# Patient Record
Sex: Male | Born: 1960 | Race: White | Hispanic: No | Marital: Single | Smoking: Former smoker
Health system: Southern US, Community
[De-identification: ages and names within clinical notes are randomized; demographics above are authoritative.]

## PROBLEM LIST (undated history)

## (undated) DIAGNOSIS — T7840XA Allergy, unspecified, initial encounter: Secondary | ICD-10-CM

## (undated) DIAGNOSIS — R0600 Dyspnea, unspecified: Secondary | ICD-10-CM

## (undated) DIAGNOSIS — D689 Coagulation defect, unspecified: Secondary | ICD-10-CM

## (undated) DIAGNOSIS — J385 Laryngeal spasm: Secondary | ICD-10-CM

## (undated) DIAGNOSIS — E785 Hyperlipidemia, unspecified: Secondary | ICD-10-CM

## (undated) DIAGNOSIS — K429 Umbilical hernia without obstruction or gangrene: Secondary | ICD-10-CM

## (undated) HISTORY — DX: Hyperlipidemia, unspecified: E78.5

## (undated) HISTORY — DX: Laryngeal spasm: J38.5

## (undated) HISTORY — DX: Dyspnea, unspecified: R06.00

## (undated) HISTORY — DX: Allergy, unspecified, initial encounter: T78.40XA

## (undated) HISTORY — DX: Umbilical hernia without obstruction or gangrene: K42.9

## (undated) HISTORY — DX: Coagulation defect, unspecified: D68.9

## (undated) HISTORY — PX: NASAL SEPTUM SURGERY: SHX37

## (undated) HISTORY — PX: POLYPECTOMY: SHX149

---

## 1999-11-16 ENCOUNTER — Other Ambulatory Visit: Admission: RE | Admit: 1999-11-16 | Discharge: 1999-11-16 | Payer: Self-pay | Admitting: Urology

## 2000-12-28 ENCOUNTER — Encounter: Payer: Self-pay | Admitting: Emergency Medicine

## 2000-12-28 ENCOUNTER — Emergency Department (HOSPITAL_COMMUNITY): Admission: EM | Admit: 2000-12-28 | Discharge: 2000-12-28 | Payer: Self-pay | Admitting: *Deleted

## 2005-08-05 ENCOUNTER — Ambulatory Visit: Payer: Self-pay | Admitting: Family Medicine

## 2011-09-14 ENCOUNTER — Encounter: Payer: Self-pay | Admitting: Internal Medicine

## 2011-10-05 ENCOUNTER — Encounter: Payer: Self-pay | Admitting: Internal Medicine

## 2011-10-13 ENCOUNTER — Ambulatory Visit (INDEPENDENT_AMBULATORY_CARE_PROVIDER_SITE_OTHER): Payer: 59 | Admitting: Cardiology

## 2011-10-13 ENCOUNTER — Encounter: Payer: Self-pay | Admitting: Cardiology

## 2011-10-13 VITALS — BP 160/100 | HR 89 | Ht 67.0 in | Wt 227.0 lb

## 2011-10-13 DIAGNOSIS — F17200 Nicotine dependence, unspecified, uncomplicated: Secondary | ICD-10-CM

## 2011-10-13 DIAGNOSIS — E663 Overweight: Secondary | ICD-10-CM

## 2011-10-13 DIAGNOSIS — Z720 Tobacco use: Secondary | ICD-10-CM | POA: Insufficient documentation

## 2011-10-13 DIAGNOSIS — R0602 Shortness of breath: Secondary | ICD-10-CM

## 2011-10-13 DIAGNOSIS — R0600 Dyspnea, unspecified: Secondary | ICD-10-CM | POA: Insufficient documentation

## 2011-10-13 DIAGNOSIS — R9431 Abnormal electrocardiogram [ECG] [EKG]: Secondary | ICD-10-CM | POA: Insufficient documentation

## 2011-10-13 NOTE — Progress Notes (Signed)
   HPI The patient presents for evaluation of an abnormal EKG. He was recently noted to have a right bundle branch block. He has no history of this there was no old EKG for comparison. He denies any cardiovascular history he has had no testing in the past. He denies any cardiovascular symptoms. He works a vigorous exerting job. He walks quite a bit looking for arrowheads.  The patient denies any new symptoms such as chest discomfort, neck or arm discomfort. There has been no PND or orthopnea. There have been no reported palpitations, presyncope or syncope.  He does get some dyspnea walking up an incline which has been slowly progressive over the past year. He denies any edema.   No Known Allergies  Current Outpatient Prescriptions  Medication Sig Dispense Refill  . aspirin 325 MG tablet Take 325 mg by mouth daily.      . niacin (NIASPAN) 500 MG CR tablet Take 500 mg by mouth at bedtime.        Past Medical History  Diagnosis Date  . Hyperlipidemia     Past Surgical History  Procedure Date  . Nasal septum surgery     Family History  Problem Relation Age of Onset  . Asthma Mother   . Hypertension Father   . Peripheral vascular disease Father 66  . Coronary artery disease Father 76    History   Social History  . Marital Status: Single    Spouse Name: N/A    Number of Children: 1  . Years of Education: N/A   Occupational History  . Not on file.   Social History Main Topics  . Smoking status: Current Everyday Smoker -- 1.0 packs/day for 25 years    Types: Cigarettes  . Smokeless tobacco: Not on file  . Alcohol Use: Not on file  . Drug Use: Not on file  . Sexually Active: Not on file   Other Topics Concern  . Not on file   Social History Narrative   Lives alone.    ROS:  As stated in the HPI and negative for all other systems.   PHYSICAL EXAM BP 160/100  Pulse 89  Ht 5\' 7"  (1.702 m)  Wt 227 lb (102.967 kg)  BMI 35.55 kg/m2 GENERAL:  Well appearing HEENT:   Pupils equal round and reactive, fundi not visualized, oral mucosa unremarkable NECK:  No jugular venous distention, waveform within normal limits, carotid upstroke brisk and symmetric, no bruits, no thyromegaly LYMPHATICS:  No cervical, inguinal adenopathy LUNGS:  Clear to auscultation bilaterally BACK:  No CVA tenderness CHEST:  Unremarkable HEART:  PMI not displaced or sustained,S1 and S2 within normal limits, no S3, no S4, no clicks, no rubs, no murmurs ABD:  Flat, positive bowel sounds normal in frequency in pitch, no bruits, no rebound, no guarding, no midline pulsatile mass, no hepatomegaly, no splenomegaly EXT:  2 plus pulses throughout, no edema, no cyanosis no clubbing SKIN:  No rashes no nodules NEURO:  Cranial nerves II through XII grossly intact, motor grossly intact throughout PSYCH:  Cognitively intact, oriented to person place and time  EKG: Sinus rhythm, rate 82, right bundle branch block, no acute ST-T wave changes, no EKGs for comparison.  09/07/11  ASSESSMENT AND PLAN

## 2011-10-13 NOTE — Assessment & Plan Note (Signed)
This will be evaluated as above. I will also check a BNP level.

## 2011-10-13 NOTE — Assessment & Plan Note (Signed)
He has right bundle branch block but no suggestion of ischemia or other structural heart disease.  I will bring the patient back for a POET (Plain Old Exercise Test). This will allow me to screen for obstructive coronary disease, risk stratify and very importantly provide a prescription for exercise.  I will do this because of his risk factors and the dyspnea.

## 2011-10-13 NOTE — Assessment & Plan Note (Signed)
We discussed a specific strategy for tobacco cessation.  (Greater than three minutes discussing tobacco cessation.)  He's not sure he wants to stop smoking.

## 2011-10-13 NOTE — Patient Instructions (Addendum)
The current medical regimen is effective;  continue present plan and medications.  Your physician has requested that you have an exercise tolerance test. For further information please visit [email or web address] Please also follow instruction sheet, as given.  Please have blood work this same day (BNP).

## 2011-10-13 NOTE — Assessment & Plan Note (Signed)
The patient understands the need to lose weight with diet and exercise. We have discussed specific strategies for this.  

## 2011-11-17 ENCOUNTER — Encounter: Payer: Self-pay | Admitting: Physician Assistant

## 2011-11-17 ENCOUNTER — Ambulatory Visit (INDEPENDENT_AMBULATORY_CARE_PROVIDER_SITE_OTHER): Payer: 59 | Admitting: Physician Assistant

## 2011-11-17 DIAGNOSIS — R9431 Abnormal electrocardiogram [ECG] [EKG]: Secondary | ICD-10-CM

## 2011-11-17 NOTE — Procedures (Signed)
Exercise Treadmill Test  Pre-Exercise Testing Evaluation Rhythm: normal sinus /RBBB Rate: 81   PR:  .13 QRS:  .12  QT:  .38 QTc: .45     Test  Exercise Tolerance Test Ordering MD: Angelina Sheriff, MD  Interpreting MD: Tereso Newcomer , PA-C  Unique Test No: 1  Treadmill:  1  Indication for ETT: Abnormal EKG  Contraindication to ETT: No   Stress Modality: exercise - treadmill  Cardiac Imaging Performed: non   Protocol: standard Bruce - maximal  Max BP:  208/68  Max MPHR (bpm):  168 85% MPR (bpm):  142  MPHR obtained (bpm): 144 % MPHR obtained:  86%  Reached 85% MPHR (min:sec):  5:58 Total Exercise Time (min-sec):  6:00  Workload in METS:  7.0 Borg Scale: 15  Reason ETT Terminated:  dyspnea    ST Segment Analysis At Rest: normal ST segments - no evidence of significant ST depression With Exercise: no evidence of significant ST depression  Other Information Arrhythmia:  No Angina during ETT:  absent (0) Quality of ETT:  diagnostic  ETT Interpretation:  normal - no evidence of ischemia by ST analysis  Comments: Fair exercise tolerance. Poor exercise recovery. No chest pain. Normal BP response to exercise. No ST-T changes to suggest ischemia.   Recommendations: Follow up with Dr. Rollene Rotunda as directed. Tereso Newcomer, PA-C  9:14 AM 11/17/2011

## 2013-07-16 ENCOUNTER — Ambulatory Visit (INDEPENDENT_AMBULATORY_CARE_PROVIDER_SITE_OTHER): Payer: 59 | Admitting: Family Medicine

## 2013-07-16 ENCOUNTER — Encounter: Payer: Self-pay | Admitting: Family Medicine

## 2013-07-16 VITALS — BP 128/73 | HR 77 | Temp 99.3°F | Ht 66.0 in | Wt 217.6 lb

## 2013-07-16 DIAGNOSIS — R509 Fever, unspecified: Secondary | ICD-10-CM

## 2013-07-16 DIAGNOSIS — J209 Acute bronchitis, unspecified: Secondary | ICD-10-CM

## 2013-07-16 DIAGNOSIS — R05 Cough: Secondary | ICD-10-CM

## 2013-07-16 DIAGNOSIS — R059 Cough, unspecified: Secondary | ICD-10-CM

## 2013-07-16 DIAGNOSIS — R52 Pain, unspecified: Secondary | ICD-10-CM

## 2013-07-16 LAB — POCT INFLUENZA A/B
Influenza A, POC: NEGATIVE
Influenza B, POC: NEGATIVE

## 2013-07-16 MED ORDER — PREDNISONE 10 MG PO TABS: ORAL_TABLET | ORAL | Status: DC

## 2013-07-16 MED ORDER — BENZONATATE 100 MG PO CAPS: 200.0000 mg | ORAL_CAPSULE | Freq: Three times a day (TID) | ORAL | Status: DC | PRN

## 2013-07-16 MED ORDER — LEVOFLOXACIN 500 MG PO TABS: 500.0000 mg | ORAL_TABLET | Freq: Every day | ORAL | Status: DC

## 2013-07-16 NOTE — Progress Notes (Signed)
   Subjective:    Patient ID: Jeffrey Abbott, male    DOB: Feb 15, 1961, 53 y.o.   MRN: [id number]  HPI This 53 y.o. male presents for evaluation of cough and uri sx's for over a week.   Review of Systems No chest pain, SOB, HA, dizziness, vision change, N/V, diarrhea, constipation, dysuria, urinary urgency or frequency, myalgias, arthralgias or rash.     Objective:   Physical Exam Vital signs noted  Well developed well nourished male.  HEENT - Head atraumatic Normocephalic                Eyes - PERRLA, Conjuctiva - clear Sclera- Clear EOMI                Ears - EAC's Wnl TM's Wnl Gross Hearing WNL                Nose - Nares patent                 Throat - oropharanx wnl Respiratory - Lungs CTA bilateral Cardiac - RRR S1 and S2 without murmur GI - Abdomen soft Nontender and bowel sounds active x 4 Extremities - No edema. Neuro - Grossly intact.  Results for orders placed in visit on 07/16/13  POCT INFLUENZA A/B      Result Value Ref Range   Influenza A, POC Negative     Influenza B, POC Negative         Assessment & Plan:  Cough - Plan: POCT Influenza A/B, levofloxacin (LEVAQUIN) 500 MG tablet, predniSONE (DELTASONE) 10 MG tablet, benzonatate (TESSALON PERLES) 100 MG capsule  Fever - Plan: POCT Influenza A/B, levofloxacin (LEVAQUIN) 500 MG tablet, predniSONE (DELTASONE) 10 MG tablet, benzonatate (TESSALON PERLES) 100 MG capsule  Body aches - Plan: POCT Influenza A/B, levofloxacin (LEVAQUIN) 500 MG tablet, predniSONE (DELTASONE) 10 MG tablet, benzonatate (TESSALON PERLES) 100 MG capsule  Acute bronchitis - Plan: levofloxacin (LEVAQUIN) 500 MG tablet, predniSONE (DELTASONE) 10 MG tablet, benzonatate (TESSALON PERLES) 100 MG capsule  Lysbeth Penner FNP

## 2014-04-09 ENCOUNTER — Telehealth: Payer: Self-pay | Admitting: Family Medicine

## 2014-04-09 NOTE — Telephone Encounter (Signed)
Left message. Last office visit 07-16-13.  He will need an office visit to dx problem.

## 2014-12-26 ENCOUNTER — Ambulatory Visit: Payer: Self-pay | Admitting: Family Medicine

## 2014-12-27 ENCOUNTER — Encounter (INDEPENDENT_AMBULATORY_CARE_PROVIDER_SITE_OTHER): Payer: Self-pay

## 2014-12-27 ENCOUNTER — Ambulatory Visit (INDEPENDENT_AMBULATORY_CARE_PROVIDER_SITE_OTHER): Payer: 59 | Admitting: Family Medicine

## 2014-12-27 ENCOUNTER — Encounter: Payer: Self-pay | Admitting: Family Medicine

## 2014-12-27 VITALS — BP 137/83 | HR 78 | Temp 97.6°F | Ht 66.0 in | Wt 233.6 lb

## 2014-12-27 DIAGNOSIS — K429 Umbilical hernia without obstruction or gangrene: Secondary | ICD-10-CM | POA: Insufficient documentation

## 2014-12-27 NOTE — Progress Notes (Signed)
   HPI  Patient presents today for evaluation of hernia  Patient explains that it has shown up over the last year to year and a half. He states that it's getting slightly bigger but really not bothering him. He states that very rarely he does certain movements that cause a slight twinge of pain in the area.  He denies any episodes of incarceration or concern for incarceration. He also denies fever, chills, sweats. He states that it's soft all the time and largely nontender even to palpation.  He's never had abdominal surgery.  Stopped smoking 6 months ago  PMH: Smoking status noted ROS: Per HPI  Objective: BP 137/83 mmHg  Pulse 78  Temp(Src) 97.6 F (36.4 C) (Oral)  Ht 5\' 6"  (1.676 m)  Wt 233 lb 9.6 oz (105.96 kg)  BMI 37.72 kg/m2 Gen: NAD, alert, cooperative with exam HEENT: NCAT CV: RRR, good S1/S2, no murmur Resp: CTABL, no wheezes, non-labored Abd: Soft, nontender nondistended, small to medium umbilical hernia at the superior edge of the umbilicus, slight tenderness to palpation, the abdominal wall defect is felt and it's completely reducible. Ext: No edema, warm Neuro: Alert and oriented, No gross deficits  Assessment and plan:  # Umbilical hernia Provider reassurance, discussed reasons to be referred and offered referral today. After discussion he's not having much pain and it's not bothering him so he would like to wait the referral. I explained signs of incarceration and other reasons to seek emergency medical care. I explained also that I be happy to refer him to general surgery if it becomes bothersome in the future.  Laroy Apple, MD [facility] 12/27/2014, 5:18 PM

## 2014-12-27 NOTE — Patient Instructions (Signed)
Great to meet you!  Call if your hernia is getting worse and you are ready to talk to the surgeons  Hernia A hernia occurs when an internal organ pushes out through a weak spot in the abdominal wall. Hernias most commonly occur in the groin and around the navel. Hernias often can be pushed back into place (reduced). Most hernias tend to get worse over time. Some abdominal hernias can get stuck in the opening (irreducible or incarcerated hernia) and cannot be reduced. An irreducible abdominal hernia which is tightly squeezed into the opening is at risk for impaired blood supply (strangulated hernia). A strangulated hernia is a medical emergency. Because of the risk for an irreducible or strangulated hernia, surgery may be recommended to repair a hernia. CAUSES   Heavy lifting.  Prolonged coughing.  Straining to have a bowel movement.  A cut (incision) made during an abdominal surgery. HOME CARE INSTRUCTIONS   Bed rest is not required. You may continue your normal activities.  Avoid lifting more than 10 pounds (4.5 kg) or straining.  Cough gently. If you are a smoker it is best to stop. Even the best hernia repair can break down with the continual strain of coughing. Even if you do not have your hernia repaired, a cough will continue to aggravate the problem.  Do not wear anything tight over your hernia. Do not try to keep it in with an outside bandage or truss. These can damage abdominal contents if they are trapped within the hernia sac.  Eat a normal diet.  Avoid constipation. Straining over long periods of time will increase hernia size and encourage breakdown of repairs. If you cannot do this with diet alone, stool softeners may be used. SEEK IMMEDIATE MEDICAL CARE IF:   You have a fever.  You develop increasing abdominal pain.  You feel nauseous or vomit.  Your hernia is stuck outside the abdomen, looks discolored, feels hard, or is tender.  You have any changes in your  bowel habits or in the hernia that are unusual for you.  You have increased pain or swelling around the hernia.  You cannot push the hernia back in place by applying gentle pressure while lying down. MAKE SURE YOU:   Understand these instructions.  Will watch your condition.  Will get help right away if you are not doing well or get worse. Document Released: 03/29/2005 Document Revised: 06/21/2011 Document Reviewed: 11/16/2007 [facility] Patient Information 2015 [place], [place]. This information is not intended to replace advice given to you by your health care provider. Make sure you discuss any questions you have with your health care provider.

## 2015-07-21 ENCOUNTER — Telehealth: Payer: Self-pay | Admitting: Family Medicine

## 2015-07-21 DIAGNOSIS — K429 Umbilical hernia without obstruction or gangrene: Secondary | ICD-10-CM

## 2015-07-21 NOTE — Telephone Encounter (Signed)
Referral written.   Laroy Apple, MD [facility] 07/21/2015, 5:14 PM

## 2015-07-21 NOTE — Telephone Encounter (Signed)
Patient seen 12-27-2014 for hernia.  He is requesting referral for surgeon. Please advise.

## 2015-07-22 NOTE — Telephone Encounter (Signed)
Patient informed via voicemail that referral has been made and he will be getting a call with appointment information.

## 2016-02-17 ENCOUNTER — Encounter: Payer: Self-pay | Admitting: Internal Medicine

## 2016-04-13 ENCOUNTER — Ambulatory Visit: Payer: 59 | Admitting: *Deleted

## 2016-04-13 ENCOUNTER — Telehealth: Payer: Self-pay | Admitting: *Deleted

## 2016-04-13 VITALS — Ht 67.0 in | Wt 246.0 lb

## 2016-04-13 DIAGNOSIS — Z1211 Encounter for screening for malignant neoplasm of colon: Secondary | ICD-10-CM

## 2016-04-13 MED ORDER — NA SULFATE-K SULFATE-MG SULF 17.5-3.13-1.6 GM/177ML PO SOLN: ORAL | 0 refills | Status: DC

## 2016-04-13 NOTE — Telephone Encounter (Signed)
Jeffrey Abbott,  This pt is cleared for anesthetic care at [facility].  Osvaldo Angst

## 2016-04-13 NOTE — Telephone Encounter (Signed)
John,  Could you please look at this pt's chart?  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  Just wanted to let you know.  Thanks, [employer]

## 2016-04-13 NOTE — Progress Notes (Signed)
No egg or soy allergy  No anesthesia or intubation problems per pt  No diet medications taken  Registered in [place]  No sleep apnea hx or oxygen used  Hx of laryngospasms- note sent to [employer] CRNA

## 2016-04-27 ENCOUNTER — Ambulatory Visit (AMBULATORY_SURGERY_CENTER): Payer: 59 | Admitting: Internal Medicine

## 2016-04-27 ENCOUNTER — Encounter: Payer: Self-pay | Admitting: Internal Medicine

## 2016-04-27 VITALS — BP 130/77 | HR 77 | Temp 99.5°F | Resp 28 | Ht 67.0 in | Wt 246.0 lb

## 2016-04-27 DIAGNOSIS — D125 Benign neoplasm of sigmoid colon: Secondary | ICD-10-CM | POA: Diagnosis not present

## 2016-04-27 DIAGNOSIS — D124 Benign neoplasm of descending colon: Secondary | ICD-10-CM | POA: Diagnosis not present

## 2016-04-27 DIAGNOSIS — Z1212 Encounter for screening for malignant neoplasm of rectum: Secondary | ICD-10-CM | POA: Diagnosis not present

## 2016-04-27 DIAGNOSIS — K635 Polyp of colon: Secondary | ICD-10-CM

## 2016-04-27 DIAGNOSIS — D122 Benign neoplasm of ascending colon: Secondary | ICD-10-CM

## 2016-04-27 DIAGNOSIS — D123 Benign neoplasm of transverse colon: Secondary | ICD-10-CM

## 2016-04-27 DIAGNOSIS — Z1211 Encounter for screening for malignant neoplasm of colon: Secondary | ICD-10-CM | POA: Diagnosis present

## 2016-04-27 HISTORY — PX: COLONOSCOPY: SHX174

## 2016-04-27 MED ORDER — SODIUM CHLORIDE 0.9 % IV SOLN: 500.0000 mL | INTRAVENOUS | Status: DC

## 2016-04-27 NOTE — Progress Notes (Signed)
Report to PACU, RN, vss, BBS= Clear.  

## 2016-04-27 NOTE — Op Note (Signed)
[facility] Patient Name: Jeffrey Abbott Procedure Date: 04/27/2016 11:10 AM MRN: [id number] Endoscopist: Jerene Bears , MD Age: 56 Referring MD:  Date of Birth: 02-18-1961 Gender: Male Account #: [id number] Procedure:                Colonoscopy Indications:              Screening for colorectal malignant neoplasm, This                            is the patient's first colonoscopy Medicines:                Monitored Anesthesia Care Procedure:                Pre-Anesthesia Assessment:                           - Prior to the procedure, a History and Physical                            was performed, and patient medications and                            allergies were reviewed. The patient's tolerance of                            previous anesthesia was also reviewed. The risks                            and benefits of the procedure and the sedation                            options and risks were discussed with the patient.                            All questions were answered, and informed consent                            was obtained. Prior Anticoagulants: The patient has                            taken no previous anticoagulant or antiplatelet                            agents. ASA Grade Assessment: II - A patient with                            mild systemic disease. After reviewing the risks                            and benefits, the patient was deemed in                            satisfactory condition to undergo the procedure.  After obtaining informed consent, the colonoscope                            was passed under direct vision. Throughout the                            procedure, the patient's blood pressure, pulse, and                            oxygen saturations were monitored continuously. The                            Model CF-HQ190L [id number]) scope was introduced                            through the anus and  advanced to the the cecum,                            identified by appendiceal orifice and ileocecal                            valve. The colonoscopy was performed without                            difficulty. The patient tolerated the procedure                            well. The quality of the bowel preparation was                            good. The ileocecal valve, appendiceal orifice, and                            rectum were photographed. Scope In: 11:21:39 AM Scope Out: 11:48:46 AM Scope Withdrawal Time: 0 hours 25 minutes 2 seconds  Total Procedure Duration: 0 hours 27 minutes 7 seconds  Findings:                 The digital rectal exam was normal.                           A 5 mm polyp was found in the ascending colon. The                            polyp was sessile. The polyp was removed with a                            cold snare. Resection and retrieval were complete.                           A 15 mm polyp was found in the ascending colon. The                            polyp was sessile.  The polyp was removed with a hot                            snare. Resection and retrieval were complete.                           A 5 mm polyp was found in the hepatic flexure. The                            polyp was sessile. The polyp was removed with a                            cold snare. Resection and retrieval were complete.                           Three sessile polyps were found in the transverse                            colon. The polyps were 4 to 5 mm in size. These                            polyps were removed with a cold snare. Resection                            and retrieval were complete.                           Two sessile polyps were found in the transverse                            colon. The polyps were 7 to 10 mm in size. These                            polyps were removed with a hot snare. Resection and                            retrieval were  complete.                           Three sessile polyps were found in the sigmoid                            colon and descending colon. The polyps were 3 to 6                            mm in size. These polyps were removed with a cold                            snare. Resection and retrieval were complete.                           A few small-mouthed diverticula were found from  hepatic flexure to sigmoid colon.                           Internal hemorrhoids were found during                            retroflexion. The hemorrhoids were small. Complications:            No immediate complications. Estimated Blood Loss:     Estimated blood loss was minimal. Impression:               - One 5 mm polyp in the ascending colon, removed                            with a cold snare. Resected and retrieved.                           - One 15 mm polyp in the ascending colon, removed                            with a hot snare. Resected and retrieved.                           - One 5 mm polyp at the hepatic flexure, removed                            with a cold snare. Resected and retrieved.                           - Three 4 to 5 mm polyps in the transverse colon,                            removed with a cold snare. Resected and retrieved.                           - Two 7 to 10 mm polyps in the transverse colon,                            removed with a hot snare. Resected and retrieved.                           - Three 3 to 6 mm polyps in the sigmoid colon and                            in the descending colon, removed with a cold snare.                            Resected and retrieved.                           - Mild diverticulosis from hepatic flexure to  sigmoid colon.                           - Internal hemorrhoids. Recommendation:           - Patient has a contact number available for                            emergencies. The  signs and symptoms of potential                            delayed complications were discussed with the                            patient. Return to normal activities tomorrow.                            Written discharge instructions were provided to the                            patient.                           - Resume previous diet.                           - Continue present medications.                           - Await pathology results.                           - No ibuprofen, naproxen, or other non-steroidal                            anti-inflammatory drugs for 2 weeks after polyp                            removal.                           - Repeat colonoscopy is recommended for                            surveillance. The colonoscopy date will be                            determined after pathology results from today's                            exam become available for review. Jerene Bears, MD 04/27/2016 11:58:29 AM This report has been signed electronically.

## 2016-04-27 NOTE — Progress Notes (Signed)
Called to room to assist during endoscopic procedure.  Patient ID and intended procedure confirmed with present staff. Received instructions for my participation in the procedure from the performing physician.  

## 2016-04-27 NOTE — Patient Instructions (Signed)
YOU HAD AN ENDOSCOPIC PROCEDURE TODAY AT [facility]:   Refer to the procedure report that was given to you for any specific questions about what was found during the examination.  If the procedure report does not answer your questions, please call your gastroenterologist to clarify.  If you requested that your care partner not be given the details of your procedure findings, then the procedure report has been included in a sealed envelope for you to review at your convenience later.  YOU SHOULD EXPECT: Some feelings of bloating in the abdomen. Passage of more gas than usual.  Walking can help get rid of the air that was put into your GI tract during the procedure and reduce the bloating. If you had a lower endoscopy (such as a colonoscopy or flexible sigmoidoscopy) you may notice spotting of blood in your stool or on the toilet paper. If you underwent a bowel prep for your procedure, you may not have a normal bowel movement for a few days.  Please Note:  You might notice some irritation and congestion in your nose or some drainage.  This is from the oxygen used during your procedure.  There is no need for concern and it should clear up in a day or so.  SYMPTOMS TO REPORT IMMEDIATELY:   Following lower endoscopy (colonoscopy or flexible sigmoidoscopy):  Excessive amounts of blood in the stool  Significant tenderness or worsening of abdominal pains  Swelling of the abdomen that is new, acute  Fever of 100F or higher   For urgent or emergent issues, a gastroenterologist can be reached at any hour by calling [phone].   DIET:  We do recommend a small meal at first, but then you may proceed to your regular diet.  Drink plenty of fluids but you should avoid alcoholic beverages for 24 hours. Try to increase the fiber in your diet, and drink plenty of water.  ACTIVITY:  You should plan to take it easy for the rest of today and you should NOT DRIVE or use heavy machinery until  tomorrow (because of the sedation medicines used during the test).    FOLLOW UP: Our staff will call the number listed on your records the next business day following your procedure to check on you and address any questions or concerns that you may have regarding the information given to you following your procedure. If we do not reach you, we will leave a message.  However, if you are feeling well and you are not experiencing any problems, there is no need to return our call.  We will assume that you have returned to your regular daily activities without incident.  If any biopsies were taken you will be contacted by phone or by letter within the next 1-3 weeks.  Please call us at [phone] if you have not heard about the biopsies in 3 weeks.    SIGNATURES/CONFIDENTIALITY: You and/or your care partner have signed paperwork which will be entered into your electronic medical record.  These signatures attest to the fact that that the information above on your After Visit Summary has been reviewed and is understood.  Full responsibility of the confidentiality of this discharge information lies with you and/or your care-partner.  Do not use Aspirin, Aleve nor Ibuprofen for 2 weeks to prevent bleeding.    You have been given a handout regarding the sleep apnea.  You might want to do a sleep study because your oxygen levels have gone way  down with snoring.

## 2016-04-28 ENCOUNTER — Telehealth: Payer: Self-pay | Admitting: *Deleted

## 2016-04-28 NOTE — Telephone Encounter (Signed)
  Follow up Call-  Call back number 04/27/2016  Post procedure Call Back phone  # [phone]  Permission to leave phone message Yes  Some recent data might be hidden    [facility]

## 2016-05-05 ENCOUNTER — Encounter: Payer: Self-pay | Admitting: Internal Medicine

## 2016-11-23 ENCOUNTER — Ambulatory Visit (INDEPENDENT_AMBULATORY_CARE_PROVIDER_SITE_OTHER): Payer: 59 | Admitting: *Deleted

## 2016-11-23 DIAGNOSIS — Z23 Encounter for immunization: Secondary | ICD-10-CM | POA: Diagnosis not present

## 2016-11-23 NOTE — Progress Notes (Signed)
Pt given 2nd Shingrix vaccine Tolerated well 

## 2016-12-17 ENCOUNTER — Telehealth: Payer: Self-pay | Admitting: Family Medicine

## 2017-05-17 ENCOUNTER — Encounter: Payer: Self-pay | Admitting: Nutrition

## 2018-10-10 ENCOUNTER — Encounter: Payer: Self-pay | Admitting: Nurse Practitioner

## 2018-10-10 ENCOUNTER — Ambulatory Visit (INDEPENDENT_AMBULATORY_CARE_PROVIDER_SITE_OTHER): Payer: 59 | Admitting: Nurse Practitioner

## 2018-10-10 ENCOUNTER — Other Ambulatory Visit: Payer: Self-pay

## 2018-10-10 DIAGNOSIS — H9191 Unspecified hearing loss, right ear: Secondary | ICD-10-CM | POA: Diagnosis not present

## 2018-10-10 NOTE — Progress Notes (Signed)
   Virtual Visit via telephone Note  I connected with Jeffrey Abbott on 10/10/18 at 10:25 by telephone and verified that I am speaking with the correct person using two identifiers. Jeffrey Abbott is currently located at work and no one is currently with her during visit. The provider, Mary-Margaret Hassell Done, FNP is located in their office at time of visit.  I discussed the limitations, risks, security and privacy concerns of performing an evaluation and management service by telephone and the availability of in person appointments. I also discussed with the patient that there may be a patient responsible charge related to this service. The patient expressed understanding and agreed to proceed.   History and Present Illness:  Patient calls in c/o problems hearing for the last 3-4 months. He says that left ear will totally block off when he is laying down. Right ear is beginning to stop up as well.    Review of Systems  HENT: Positive for ear pain. Negative for congestion, ear discharge, sinus pain, sore throat and tinnitus.   Respiratory: Negative.   Cardiovascular: Negative.   Neurological: Negative.   Psychiatric/Behavioral: Negative.   All other systems reviewed and are negative.    Observations/Objective Alert and oriented Difficult for him to hear me on phone  Assessment and Plan: Jeffrey Abbott in today with chief complaint of Otalgia   1. Hearing loss of right ear, unspecified hearing loss type Patient will come into office on 10/14/18 to have ears cleaned out.   Follow Up Instructions: prn    I discussed the assessment and treatment plan with the patient. The patient was provided an opportunity to ask questions and all were answered. The patient agreed with the plan and demonstrated an understanding of the instructions.   The patient was advised to call back or seek an in-person evaluation if the symptoms worsen or if the condition fails to improve as anticipated.   The above assessment and management plan was discussed with the patient. The patient verbalized understanding of and has agreed to the management plan. Patient is aware to call the clinic if symptoms persist or worsen. Patient is aware when to return to the clinic for a follow-up visit. Patient educated on when it is appropriate to go to the emergency department.   Time call ended:  10:40  I provided 10 minutes of non-face-to-face time during this encounter.    Mary-Margaret Hassell Done, FNP

## 2018-10-13 ENCOUNTER — Encounter: Payer: Self-pay | Admitting: Nurse Practitioner

## 2018-10-13 ENCOUNTER — Other Ambulatory Visit: Payer: Self-pay

## 2018-10-13 ENCOUNTER — Ambulatory Visit (INDEPENDENT_AMBULATORY_CARE_PROVIDER_SITE_OTHER): Payer: 59 | Admitting: Nurse Practitioner

## 2018-10-13 VITALS — BP 140/84 | HR 64 | Temp 97.8°F | Ht 67.0 in | Wt 239.0 lb

## 2018-10-13 DIAGNOSIS — H6123 Impacted cerumen, bilateral: Secondary | ICD-10-CM | POA: Diagnosis not present

## 2018-10-13 NOTE — Progress Notes (Signed)
   Subjective:    Patient ID: NASHAUN HILLMER, male    DOB: [birth year], 58 y.o.   MRN: [id number]   Chief Complaint: Check ears   HPI Patient called in earlier this week ears stopped up and hearing loss. He was told he needed to come in to be seen. Today he says that he still cannot hear well.   Review of Systems  Constitutional: Negative for activity change and appetite change.  HENT: Negative.   Eyes: Negative for pain.  Respiratory: Negative for shortness of breath.   Cardiovascular: Negative for chest pain, palpitations and leg swelling.  Gastrointestinal: Negative for abdominal pain.  Endocrine: Negative for polydipsia.  Genitourinary: Negative.   Skin: Negative for rash.  Neurological: Negative for dizziness, weakness and headaches.  Hematological: Does not bruise/bleed easily.  Psychiatric/Behavioral: Negative.   All other systems reviewed and are negative.      Objective:   Physical Exam Vitals signs and nursing note reviewed.  Constitutional:      Appearance: Normal appearance.  HENT:     Head:     Comments: bil cerumen impaction- irrigated with copious amounts removed    Right Ear: Tympanic membrane and ear canal normal.     Left Ear: Tympanic membrane and ear canal normal.     Nose: Nose normal.  Eyes:     Extraocular Movements: Extraocular movements intact.     Pupils: Pupils are equal, round, and reactive to light.  Cardiovascular:     Rate and Rhythm: Normal rate and regular rhythm.     Heart sounds: Normal heart sounds.  Pulmonary:     Breath sounds: Normal breath sounds.  Skin:    General: Skin is warm and dry.  Neurological:     General: No focal deficit present.     Mental Status: He is alert and oriented to person, place, and time.  Psychiatric:        Mood and Affect: Mood normal.        Behavior: Behavior normal.    BP 140/84   Pulse 64   Temp 97.8 F (36.6 C) (Oral)   Ht 5\' 7"  (1.702 m)   Wt 239 lb (108.4 kg)   BMI 37.43 kg/m        Assessment & Plan:  [name] in today with chief complaint of Check ears   1. Bilateral impacted cerumen Debrox in ears 2-3x a week RTO prn  Mary-Margaret Hassell Done, FNP

## 2018-10-13 NOTE — Patient Instructions (Signed)
Earwax Buildup, Adult The ears produce a substance called earwax that helps keep bacteria out of the ear and protects the skin in the ear canal. Occasionally, earwax can build up in the ear and cause discomfort or hearing loss. What increases the risk? This condition is more likely to develop in people who:  Are male.  Are elderly.  Naturally produce more earwax.  Clean their ears often with cotton swabs.  Use earplugs often.  Use in-ear headphones often.  Wear hearing aids.  Have narrow ear canals.  Have earwax that is overly thick or sticky.  Have eczema.  Are dehydrated.  Have excess hair in the ear canal. What are the signs or symptoms? Symptoms of this condition include:  Reduced or muffled hearing.  A feeling of fullness in the ear or feeling that the ear is plugged.  Fluid coming from the ear.  Ear pain.  Ear itch.  Ringing in the ear.  Coughing.  An obvious piece of earwax that can be seen inside the ear canal. How is this diagnosed? This condition may be diagnosed based on:  Your symptoms.  Your medical history.  An ear exam. During the exam, your health care provider will look into your ear with an instrument called an otoscope. You may have tests, including a hearing test. How is this treated? This condition may be treated by:  Using ear drops to soften the earwax.  Having the earwax removed by a health care provider. The health care provider may: ? Flush the ear with water. ? Use an instrument that has a loop on the end (curette). ? Use a suction device.  Surgery to remove the wax buildup. This may be done in severe cases. Follow these instructions at home:   Take over-the-counter and prescription medicines only as told by your health care provider.  Do not put any objects, including cotton swabs, into your ear. You can clean the opening of your ear canal with a washcloth or facial tissue.  Follow instructions from your health care  provider about cleaning your ears. Do not over-clean your ears.  Drink enough fluid to keep your urine clear or pale yellow. This will help to thin the earwax.  Keep all follow-up visits as told by your health care provider. If earwax builds up in your ears often or if you use hearing aids, consider seeing your health care provider for routine, preventive ear cleanings. Ask your health care provider how often you should schedule your cleanings.  If you have hearing aids, clean them according to instructions from the manufacturer and your health care provider. Contact a health care provider if:  You have ear pain.  You develop a fever.  You have blood, pus, or other fluid coming from your ear.  You have hearing loss.  You have ringing in your ears that does not go away.  Your symptoms do not improve with treatment.  You feel like the room is spinning (vertigo). Summary  Earwax can build up in the ear and cause discomfort or hearing loss.  The most common symptoms of this condition include reduced or muffled hearing and a feeling of fullness in the ear or feeling that the ear is plugged.  This condition may be diagnosed based on your symptoms, your medical history, and an ear exam.  This condition may be treated by using ear drops to soften the earwax or by having the earwax removed by a health care provider.  Do not put any   objects, including cotton swabs, into your ear. You can clean the opening of your ear canal with a washcloth or facial tissue. This information is not intended to replace advice given to you by your health care provider. Make sure you discuss any questions you have with your health care provider. Document Released: 05/06/2004 Document Revised: 03/11/2017 Document Reviewed: 06/09/2016 Elsevier Patient Education  2020 [employer]  

## 2018-10-18 ENCOUNTER — Ambulatory Visit: Payer: Self-pay | Admitting: Family Medicine

## 2018-10-26 ENCOUNTER — Encounter: Payer: 59 | Admitting: Nurse Practitioner

## 2018-10-26 ENCOUNTER — Other Ambulatory Visit: Payer: Self-pay

## 2018-10-26 NOTE — Progress Notes (Signed)
   Virtual Visit via telephone Note  I connected with Jeffrey Abbott on 10/26/18 at 4:55 by telephone and verified that I am speaking with the correct person using two identifiers. EMILLIANO DILWORTH is currently located at home and no one is currently with her during visit. The provider, Mary-Margaret Hassell Done, FNP is located in their office at time of visit.  I discussed the limitations, risks, security and privacy concerns of performing an evaluation and management service by telephone and the availability of in person appointments. I also discussed with the patient that there may be a patient responsible charge related to this service. The patient expressed understanding and agreed to proceed.   History and Present Illness:   Chief Complaint: Rash   HPI Attempted to reach patient at 4:55- no answer, 5:00- no answer( left message), 5:10- no answer( left message). I also attempted  To call his mobile number 3x and no answer but no voice mail to leave message, last attempt was made at 5:15 to house phone( left message will need to reschedule his appointmnet for tomorrow).   ROS   Observations/Objective: erroneous  Assessment and Plan: erroneous  Follow Up Instructions: erroneous    I discussed the assessment and treatment plan with the patient. The patient was provided an opportunity to ask questions and all were answered. The patient agreed with the plan and demonstrated an understanding of the instructions.   The patient was advised to call back or seek an in-person evaluation if the symptoms worsen or if the condition fails to improve as anticipated.  The above assessment and management plan was discussed with the patient. The patient verbalized understanding of and has agreed to the management plan. Patient is aware to call the clinic if symptoms persist or worsen. Patient is aware when to return to the clinic for a follow-up visit. Patient educated on when it is appropriate to go  to the emergency department.   Time call ended:  Last call attempt was at 5:15  I attenmpted to reach patient for 20 minutes with no answer Mary-Margaret Hassell Done, FNP

## 2019-12-10 ENCOUNTER — Encounter: Payer: Self-pay | Admitting: Nurse Practitioner

## 2019-12-10 ENCOUNTER — Other Ambulatory Visit: Payer: Self-pay

## 2019-12-10 ENCOUNTER — Ambulatory Visit (INDEPENDENT_AMBULATORY_CARE_PROVIDER_SITE_OTHER): Payer: 59 | Admitting: Nurse Practitioner

## 2019-12-10 VITALS — BP 137/84 | HR 82 | Temp 97.2°F | Ht 67.0 in | Wt 237.6 lb

## 2019-12-10 DIAGNOSIS — H6123 Impacted cerumen, bilateral: Secondary | ICD-10-CM

## 2019-12-10 NOTE — Patient Instructions (Addendum)
Earwax Buildup, Adult The ears produce a substance called earwax that helps keep bacteria out of the ear and protects the skin in the ear canal. Occasionally, earwax can build up in the ear and cause discomfort or hearing loss. What increases the risk? This condition is more likely to develop in people who:  Are male.  Are elderly.  Naturally produce more earwax.  Clean their ears often with cotton swabs.  Use earplugs often.  Use in-ear headphones often.  Wear hearing aids.  Have narrow ear canals.  Have earwax that is overly thick or sticky.  Have eczema.  Are dehydrated.  Have excess hair in the ear canal. What are the signs or symptoms? Symptoms of this condition include:  Reduced or muffled hearing.  A feeling of fullness in the ear or feeling that the ear is plugged.  Fluid coming from the ear.  Ear pain.  Ear itch.  Ringing in the ear.  Coughing.  An obvious piece of earwax that can be seen inside the ear canal. How is this diagnosed? This condition may be diagnosed based on:  Your symptoms.  Your medical history.  An ear exam. During the exam, your health care provider will look into your ear with an instrument called an otoscope. You may have tests, including a hearing test. How is this treated? This condition may be treated by:  Using ear drops to soften the earwax.  Having the earwax removed by a health care provider. The health care provider may: ? Flush the ear with water. ? Use an instrument that has a loop on the end (curette). ? Use a suction device.  Surgery to remove the wax buildup. This may be done in severe cases. Follow these instructions at home:   Take over-the-counter and prescription medicines only as told by your health care provider.  Do not put any objects, including cotton swabs, into your ear. You can clean the opening of your ear canal with a washcloth or facial tissue.  Follow instructions from your health care  provider about cleaning your ears. Do not over-clean your ears.  Drink enough fluid to keep your urine clear or pale yellow. This will help to thin the earwax.  Keep all follow-up visits as told by your health care provider. If earwax builds up in your ears often or if you use hearing aids, consider seeing your health care provider for routine, preventive ear cleanings. Ask your health care provider how often you should schedule your cleanings.  If you have hearing aids, clean them according to instructions from the manufacturer and your health care provider. Contact a health care provider if:  You have ear pain.  You develop a fever.  You have blood, pus, or other fluid coming from your ear.  You have hearing loss.  You have ringing in your ears that does not go away.  Your symptoms do not improve with treatment.  You feel like the room is spinning (vertigo). Summary  Earwax can build up in the ear and cause discomfort or hearing loss.  The most common symptoms of this condition include reduced or muffled hearing and a feeling of fullness in the ear or feeling that the ear is plugged.  This condition may be diagnosed based on your symptoms, your medical history, and an ear exam.  This condition may be treated by using ear drops to soften the earwax or by having the earwax removed by a health care provider.  Do not put any   objects, including cotton swabs, into your ear. You can clean the opening of your ear canal with a washcloth or facial tissue. This information is not intended to replace advice given to you by your health care provider. Make sure you discuss any questions you have with your health care provider. Document Revised: 03/11/2017 Document Reviewed: 06/09/2016 Elsevier Patient Education  2020 [employer] Inc[employer]  

## 2019-12-10 NOTE — Progress Notes (Signed)
Acute Office Visit  Subjective:    Patient ID: Jeffrey Abbott, male    DOB: 1960/05/18, 59 y.o.   MRN: [id number]  Chief Complaint  Patient presents with  . Trouble hearing    HPI Patient is in today for cerumen buildup bilateral ear.  This is not new for patient but recurrent problem.  Patient reports not being consistent with using Debrox at home after the last clinic visit.  Patient is reporting ringing in the ear without pain, fever or hearing loss.  Past Medical History:  Diagnosis Date  . Allergy   . Hyperlipidemia    no meds  . 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 Umbilical hernia     Past Surgical History:  Procedure Laterality Date  . NASAL SEPTUM SURGERY      Family History  Problem Relation Age of Onset  . Asthma Mother   . Hypertension Father   . Peripheral vascular disease Father 27  . Coronary artery disease Father 19  . Colon cancer Maternal Aunt   . Colon cancer Maternal Grandmother        possibly started in colon, traveled to stomach  . Stomach cancer Maternal Grandmother   . Esophageal cancer Neg Hx   . Rectal cancer Neg Hx     Social History   Socioeconomic History  . Marital status: Single    Spouse name: Not on file  . Number of children: 1  . Years of education: Not on file  . Highest education level: Not on file  Occupational History  . Not on file  Tobacco Use  . Smoking status: Former Smoker    Packs/day: 1.00    Years: 25.00    Pack years: 25.00    Types: Cigarettes  . Smokeless tobacco: Current User    Types: Chew  . Tobacco comment: quit smoking March 2015  Substance and Sexual Activity  . Alcohol use: No    Alcohol/week: 0.0 standard drinks  . Drug use: No  . Sexual activity: Not on file  Other Topics Concern  . Not on file  Social History Narrative   Lives alone.   Social Determinants of Health                                                                           No outpatient medications prior to visit.   Facility-Administered Medications Prior to Visit  Medication Dose Route Frequency Provider Last Rate Last Admin  . 0.9 %  sodium chloride infusion  500 mL Intravenous Continuous Pyrtle, Lajuan Lines, MD        Allergies  Allergen Reactions  . Niaspan [Niacin Er]     Joint pain    Review of Systems  HENT: Negative.  Negative for ear discharge, hearing loss and sinus pressure.        Wax build up  Eyes: Negative.   Respiratory: Negative.   Cardiovascular: Negative.   Skin: Negative.   Neurological: Negative for light-headedness.       Ringing in the ear       Objective:    Physical Exam Vitals reviewed.  HENT:  Head: Normocephalic.     Right Ear: There is impacted cerumen.     Left Ear: There is impacted cerumen.     Nose: Nose normal.     Mouth/Throat:     Mouth: Mucous membranes are moist.     Pharynx: Oropharynx is clear.  Eyes:     Conjunctiva/sclera: Conjunctivae normal.  Cardiovascular:     Pulses: Normal pulses.     Heart sounds: Normal heart sounds.  Pulmonary:     Effort: Pulmonary effort is normal.     Breath sounds: Normal breath sounds.  Musculoskeletal:     Cervical back: Neck supple.  Neurological:     Mental Status: He is alert and oriented to person, place, and time.     Comments: Ringing in the ear     BP 137/84   Pulse 82   Temp (!) 97.2 F (36.2 C) (Temporal)   Ht 5\' 7"  (1.702 m)   Wt 237 lb 9.6 oz (107.8 kg)   SpO2 97%   BMI 37.21 kg/m  Wt Readings from Last 3 Encounters:  12/10/19 237 lb 9.6 oz (107.8 kg)  10/13/18 239 lb (108.4 kg)  04/27/16 246 lb (111.6 kg)    Health Maintenance Due  Topic Date Due  . Hepatitis C Screening  Never done  . COVID-19 Vaccine (1) Never done  . HIV Screening  Never done  . COLONOSCOPY  04/27/2017  . INFLUENZA VACCINE  11/11/2019        Assessment & Plan:  Bilateral impacted cerumen Patient is a 59 year old male who  presents to clinic for cerumen buildup bilateral ear.  This is not new for patient but recurrent.  Patient reports not been consistent with using Debrox at home after the last clinic visit.  Patient is reporting ringing in the ear without pain, fever or hearing loss.  After assessment of bilateral ear, ear canal is full with earwax. Bilateral ear deimpacted.  Ear canal patent.  Provided education to patient with printed handouts given. Follow-up as needed or unresolved symptoms.  Problem List Items Addressed This Visit    None    Visit Diagnoses    Bilateral impacted cerumen    -  Primary        Ivy Lynn, NP

## 2019-12-10 NOTE — Assessment & Plan Note (Signed)
Patient is a 59 year old male who presents to clinic for cerumen buildup bilateral ear.  This is not new for patient but recurrent.  Patient reports not been consistent with using Debrox at home after the last clinic visit.  Patient is reporting ringing in the ear without pain, fever or hearing loss.  After assessment of bilateral ear, ear canal is full with earwax. Bilateral ear deimpacted.  Ear canal patent.  Provided education to patient with printed handouts given. Follow-up as needed or unresolved symptoms.

## 2020-05-20 ENCOUNTER — Ambulatory Visit (INDEPENDENT_AMBULATORY_CARE_PROVIDER_SITE_OTHER): Payer: 59 | Admitting: Nurse Practitioner

## 2020-05-20 ENCOUNTER — Encounter: Payer: Self-pay | Admitting: Nurse Practitioner

## 2020-05-20 DIAGNOSIS — R110 Nausea: Secondary | ICD-10-CM | POA: Diagnosis not present

## 2020-05-20 DIAGNOSIS — J069 Acute upper respiratory infection, unspecified: Secondary | ICD-10-CM | POA: Insufficient documentation

## 2020-05-20 MED ORDER — BENZONATATE 100 MG PO CAPS: 100.0000 mg | ORAL_CAPSULE | Freq: Three times a day (TID) | ORAL | 0 refills | Status: DC | PRN

## 2020-05-20 MED ORDER — AZITHROMYCIN 250 MG PO TABS: ORAL_TABLET | ORAL | 0 refills | Status: DC

## 2020-05-20 MED ORDER — ONDANSETRON HCL 4 MG PO TABS: 4.0000 mg | ORAL_TABLET | Freq: Three times a day (TID) | ORAL | 0 refills | Status: DC | PRN

## 2020-05-20 NOTE — Assessment & Plan Note (Signed)
Patient is reporting upper respiratory infection with increased cough, sore throat, and nausea in the last few days. Provided education to patient to increase hydration, Tessalon Perles for cough, Zofran for nausea and azithromycin.  Rx sent to pharmacy Follow-up with worsening or unresolved symptoms. Patient verbalized understanding.

## 2020-05-20 NOTE — Progress Notes (Signed)
   Virtual Visit via telephone Note Due to COVID-19 pandemic this visit was conducted virtually. This visit type was conducted due to national recommendations for restrictions regarding the COVID-19 Pandemic (e.g. social distancing, sheltering in place) in an effort to limit this patient's exposure and mitigate transmission in our community. All issues noted in this document were discussed and addressed.  A physical exam was not performed with this format.  I connected with Jeffrey Abbott on 05/20/20 at  9:31 AM by telephone and verified that I am speaking with the correct person using two identifiers. Jeffrey Abbott is currently located at during visit. The provider, Ivy Lynn, NP is located in their office at time of visit.  I discussed the limitations, risks, security and privacy concerns of performing an evaluation and management service by telephone and the availability of in person appointments. I also discussed with the patient that there may be a patient responsible charge related to this service. The patient expressed understanding and agreed to proceed.   History and Present Illness:  URI  This is a recurrent problem. The current episode started in the past 7 days. The problem has been unchanged. There has been no fever. Associated symptoms include coughing, headaches, nausea and a sore throat. Pertinent negatives include no congestion, ear pain, rash or sinus pain. He has tried nothing for the symptoms.      Review of Systems  Constitutional: Negative for fever.  HENT: Positive for sore throat. Negative for congestion, ear pain and sinus pain.   Respiratory: Positive for cough.   Gastrointestinal: Positive for nausea.  Skin: Negative for rash.  Neurological: Positive for headaches.  All other systems reviewed and are negative.    Observations/Objective: Televisit. Patient does not sound to be in distress. Assessment and Plan:  Viral upper respiratory tract  infection Patient is reporting upper respiratory infection with increased cough, sore throat, and nausea in the last few days. Provided education to patient to increase hydration, Tessalon Perles for cough, Zofran for nausea and azithromycin.  Rx sent to pharmacy Follow-up with worsening or unresolved symptoms. Patient verbalized understanding.   Follow Up Instructions: Follow-up with worsening or unresolved symptoms.    I discussed the assessment and treatment plan with the patient. The patient was provided an opportunity to ask questions and all were answered. The patient agreed with the plan and demonstrated an understanding of the instructions.   The patient was advised to call back or seek an in-person evaluation if the symptoms worsen or if the condition fails to improve as anticipated.  The above assessment and management plan was discussed with the patient. The patient verbalized understanding of and has agreed to the management plan. Patient is aware to call the clinic if symptoms persist or worsen. Patient is aware when to return to the clinic for a follow-up visit. Patient educated on when it is appropriate to go to the emergency department.   Time call ended: 9:38 AM  I provided 7 minutes of non-face-to-face time during this encounter.    Ivy Lynn, NP

## 2020-05-22 ENCOUNTER — Encounter (HOSPITAL_COMMUNITY): Payer: Self-pay

## 2020-05-22 ENCOUNTER — Emergency Department (HOSPITAL_COMMUNITY): Payer: 59

## 2020-05-22 ENCOUNTER — Inpatient Hospital Stay (HOSPITAL_COMMUNITY)
Admission: EM | Admit: 2020-05-22 | Discharge: 2020-05-30 | DRG: 177 | Disposition: A | Discharge status: Home / Self Care | Payer: 59 | Attending: Internal Medicine | Admitting: Internal Medicine

## 2020-05-22 ENCOUNTER — Other Ambulatory Visit: Payer: Self-pay

## 2020-05-22 DIAGNOSIS — U071 COVID-19: Secondary | ICD-10-CM | POA: Diagnosis not present

## 2020-05-22 DIAGNOSIS — A0839 Other viral enteritis: Secondary | ICD-10-CM | POA: Diagnosis present

## 2020-05-22 DIAGNOSIS — E785 Hyperlipidemia, unspecified: Secondary | ICD-10-CM | POA: Diagnosis present

## 2020-05-22 DIAGNOSIS — I2693 Single subsegmental pulmonary embolism without acute cor pulmonale: Secondary | ICD-10-CM | POA: Diagnosis not present

## 2020-05-22 DIAGNOSIS — J440 Chronic obstructive pulmonary disease with acute lower respiratory infection: Secondary | ICD-10-CM | POA: Diagnosis present

## 2020-05-22 DIAGNOSIS — Z8249 Family history of ischemic heart disease and other diseases of the circulatory system: Secondary | ICD-10-CM

## 2020-05-22 DIAGNOSIS — R739 Hyperglycemia, unspecified: Secondary | ICD-10-CM | POA: Diagnosis not present

## 2020-05-22 DIAGNOSIS — Z888 Allergy status to other drugs, medicaments and biological substances status: Secondary | ICD-10-CM

## 2020-05-22 DIAGNOSIS — I2699 Other pulmonary embolism without acute cor pulmonale: Secondary | ICD-10-CM

## 2020-05-22 DIAGNOSIS — R7303 Prediabetes: Secondary | ICD-10-CM | POA: Insufficient documentation

## 2020-05-22 DIAGNOSIS — Z825 Family history of asthma and other chronic lower respiratory diseases: Secondary | ICD-10-CM

## 2020-05-22 DIAGNOSIS — I4510 Unspecified right bundle-branch block: Secondary | ICD-10-CM | POA: Diagnosis present

## 2020-05-22 DIAGNOSIS — J1282 Pneumonia due to coronavirus disease 2019: Secondary | ICD-10-CM | POA: Diagnosis present

## 2020-05-22 DIAGNOSIS — R0602 Shortness of breath: Secondary | ICD-10-CM | POA: Diagnosis not present

## 2020-05-22 DIAGNOSIS — J9601 Acute respiratory failure with hypoxia: Secondary | ICD-10-CM | POA: Diagnosis present

## 2020-05-22 DIAGNOSIS — R9431 Abnormal electrocardiogram [ECG] [EKG]: Secondary | ICD-10-CM | POA: Diagnosis present

## 2020-05-22 DIAGNOSIS — Z87891 Personal history of nicotine dependence: Secondary | ICD-10-CM

## 2020-05-22 DIAGNOSIS — N179 Acute kidney failure, unspecified: Secondary | ICD-10-CM

## 2020-05-22 DIAGNOSIS — R197 Diarrhea, unspecified: Secondary | ICD-10-CM

## 2020-05-22 DIAGNOSIS — R7989 Other specified abnormal findings of blood chemistry: Secondary | ICD-10-CM

## 2020-05-22 DIAGNOSIS — T380X5A Adverse effect of glucocorticoids and synthetic analogues, initial encounter: Secondary | ICD-10-CM | POA: Diagnosis not present

## 2020-05-22 DIAGNOSIS — Z800 Family history of malignant neoplasm of digestive organs: Secondary | ICD-10-CM

## 2020-05-22 DIAGNOSIS — R0902 Hypoxemia: Secondary | ICD-10-CM

## 2020-05-22 HISTORY — DX: COVID-19: U07.1

## 2020-05-22 MED ORDER — DEXAMETHASONE SODIUM PHOSPHATE 10 MG/ML IJ SOLN
6.0000 mg | Freq: Once | INTRAMUSCULAR | Status: AC
  Administered 2020-05-23: 6 mg via INTRAVENOUS
  Filled 2020-05-22: qty 1

## 2020-05-22 NOTE — ED Provider Notes (Signed)
Jeffrey Abbott   CSN: [id number] Arrival date & time: 05/22/20  2129     History Chief Complaint  Patient presents with  . Covid Positive  . Shortness of Breath    Jeffrey Abbott is a 60 y.o. male.  The history is provided by the patient and medical records.  Shortness of Breath  Jeffrey Abbott is a 60 y.o. male who presents to the Emergency Department complaining of short of breath. He presents the emergency department for evaluation of shortness of breath. He is tested positive for COVID-19 on January 30 with a home antigen test. He has been resting at home and recuperating. Three days ago he was started on azithromycin and prednisone for his symptoms. Today he became abruptly short of breath with significant increased work of breathing. He has cough productive of white sputum. He complains of chest tightness. He also reports fevers for the last several days. He denies any nausea, vomiting, abdominal pain, leg swelling or pain. He has no known medical problems and takes no medications. He is not vaccinated for COVID-19.    Past Medical History:  Diagnosis Date  . Allergy   . Hyperlipidemia    no meds  . 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 Umbilical hernia     Patient Active Problem List   Diagnosis Date Noted  . Viral upper respiratory tract infection 05/20/2020  . Nausea 05/20/2020  . Bilateral impacted cerumen 12/10/2019  . Umbilical hernia 14/43/1540  . Abnormal EKG 10/13/2011  . Overweight(278.02) 10/13/2011  . Dyspnea 10/13/2011    Past Surgical History:  Procedure Laterality Date  . NASAL SEPTUM SURGERY         Family History  Problem Relation Age of Onset  . Asthma Mother   . Hypertension Father   . Peripheral vascular disease Father 74  . Coronary artery disease Father 13  . Colon cancer Maternal Aunt   . Colon cancer Maternal Grandmother         possibly started in colon, traveled to stomach  . Stomach cancer Maternal Grandmother   . Esophageal cancer Neg Hx   . Rectal cancer Neg Hx     Social History   Tobacco Use  . Smoking status: Former Smoker    Packs/day: 1.00    Years: 25.00    Pack years: 25.00    Types: Cigarettes  . Smokeless tobacco: Current User    Types: Chew  . Tobacco comment: quit smoking March 2015  Substance Use Topics  . Alcohol use: No    Alcohol/week: 0.0 standard drinks  . Drug use: No    Home Medications Prior to Admission medications   Medication Sig Start Date End Date Taking? Authorizing Provider  azithromycin (ZITHROMAX) 250 MG tablet 2 tablets day 1, 1 tablet day 2-5 05/20/20   Ivy Lynn, NP  benzonatate (TESSALON PERLES) 100 MG capsule Take 1 capsule (100 mg total) by mouth 3 (three) times daily as needed for cough. 05/20/20   Ivy Lynn, NP  ondansetron (ZOFRAN) 4 MG tablet Take 1 tablet (4 mg total) by mouth every 8 (eight) hours as needed for nausea or vomiting. 05/20/20   Ivy Lynn, NP    Allergies    Niaspan [niacin er]  Review of Systems   Review of Systems  Respiratory: Positive for shortness of breath.   All other systems reviewed and are negative.  Physical Exam Updated Vital Signs BP 122/63 (BP Location: Left Arm)   Pulse 94   Temp (!) 100.6 F (38.1 C) (Oral)   Resp (!) 24   SpO2 94%   Physical Exam Vitals and nursing Abbott reviewed.  Constitutional:      General: He is in acute distress.     Appearance: He is well-developed and well-nourished. He is ill-appearing.  HENT:     Head: Normocephalic and atraumatic.  Cardiovascular:     Rate and Rhythm: Regular rhythm. Tachycardia present.     Heart sounds: No murmur heard.   Pulmonary:     Effort: Respiratory distress present.     Comments: Fine crackles in the bases bilaterally Abdominal:     Palpations: Abdomen is soft.     Tenderness: There is no abdominal tenderness. There is no guarding  or rebound.  Musculoskeletal:        General: No swelling, tenderness or edema.  Skin:    General: Skin is warm and dry.  Neurological:     Mental Status: He is alert and oriented to person, place, and time.  Psychiatric:        Mood and Affect: Mood and affect normal.        Behavior: Behavior normal.     ED Results / Procedures / Treatments   Labs (all labs ordered are listed, but only abnormal results are displayed) Labs Reviewed  CULTURE, BLOOD (ROUTINE X 2)  CULTURE, BLOOD (ROUTINE X 2)  LACTIC ACID, PLASMA  LACTIC ACID, PLASMA  CBC WITH DIFFERENTIAL/PLATELET  COMPREHENSIVE METABOLIC PANEL  D-DIMER, QUANTITATIVE (NOT AT [facility])  PROCALCITONIN  LACTATE DEHYDROGENASE  FERRITIN  TRIGLYCERIDES  FIBRINOGEN  C-REACTIVE PROTEIN  POC SARS CORONAVIRUS 2 AG -  ED    EKG None  Radiology No results found.  Procedures Procedures   Medications Ordered in ED Medications - No data to display  ED Course  I have reviewed the triage vital signs and the nursing notes.  Pertinent labs & imaging results that were available during my care of the patient were reviewed by me and considered in my medical decision making (see chart for details).    MDM Rules/Calculators/A&P                         patient with home diagnosis of COVID-19 here for evaluation of increased shortness of breath starting today. He is hypoxic, setting 82% on room air. On 6 L nasal cannula is oxygen saturations improved to the mid 90s but he has persistent tachypnea. He was treated with Decadron for hypoxic respiratory failure secondary to COVID-19. Patient care transferred pending labs, imaging and further assessment.  Jeffrey Abbott was evaluated in Emergency Department on 05/22/2020 for the symptoms described in the history of present illness. He was evaluated in the context of the global COVID-19 pandemic, which necessitated consideration that the patient might be at risk for infection with the SARS-CoV-2  virus that causes COVID-19. Institutional protocols and algorithms that pertain to the evaluation of patients at risk for COVID-19 are in a state of rapid change based on information released by regulatory bodies including the CDC and federal and state organizations. These policies and algorithms were followed during the patient's care in the ED.   Final Clinical Impression(s) / ED Diagnoses Final diagnoses:  None    Rx / DC Orders ED Discharge Orders    None       Quintella Reichert, MD  05/22/20 2304  

## 2020-05-22 NOTE — ED Triage Notes (Signed)
Pt reports he tested positive for COVID on 1/30. Pt is here today do to [facility]. Pt is 82% on room air. Pt struggling to talk in full sentences without needing to catch his breath.

## 2020-05-23 ENCOUNTER — Encounter (HOSPITAL_COMMUNITY): Payer: Self-pay | Admitting: Internal Medicine

## 2020-05-23 DIAGNOSIS — J1282 Pneumonia due to coronavirus disease 2019: Secondary | ICD-10-CM | POA: Diagnosis present

## 2020-05-23 DIAGNOSIS — N179 Acute kidney failure, unspecified: Secondary | ICD-10-CM | POA: Diagnosis present

## 2020-05-23 DIAGNOSIS — U071 COVID-19: Principal | ICD-10-CM

## 2020-05-23 DIAGNOSIS — Z8249 Family history of ischemic heart disease and other diseases of the circulatory system: Secondary | ICD-10-CM | POA: Diagnosis not present

## 2020-05-23 DIAGNOSIS — R7989 Other specified abnormal findings of blood chemistry: Secondary | ICD-10-CM | POA: Diagnosis not present

## 2020-05-23 DIAGNOSIS — R0602 Shortness of breath: Secondary | ICD-10-CM | POA: Diagnosis present

## 2020-05-23 DIAGNOSIS — T380X5A Adverse effect of glucocorticoids and synthetic analogues, initial encounter: Secondary | ICD-10-CM | POA: Diagnosis not present

## 2020-05-23 DIAGNOSIS — J9601 Acute respiratory failure with hypoxia: Secondary | ICD-10-CM | POA: Diagnosis present

## 2020-05-23 DIAGNOSIS — E785 Hyperlipidemia, unspecified: Secondary | ICD-10-CM | POA: Diagnosis present

## 2020-05-23 DIAGNOSIS — Z87891 Personal history of nicotine dependence: Secondary | ICD-10-CM | POA: Diagnosis not present

## 2020-05-23 DIAGNOSIS — I2693 Single subsegmental pulmonary embolism without acute cor pulmonale: Secondary | ICD-10-CM | POA: Diagnosis not present

## 2020-05-23 DIAGNOSIS — I2699 Other pulmonary embolism without acute cor pulmonale: Secondary | ICD-10-CM | POA: Diagnosis not present

## 2020-05-23 DIAGNOSIS — I4510 Unspecified right bundle-branch block: Secondary | ICD-10-CM | POA: Diagnosis present

## 2020-05-23 DIAGNOSIS — Z888 Allergy status to other drugs, medicaments and biological substances status: Secondary | ICD-10-CM | POA: Diagnosis not present

## 2020-05-23 DIAGNOSIS — Z825 Family history of asthma and other chronic lower respiratory diseases: Secondary | ICD-10-CM | POA: Diagnosis not present

## 2020-05-23 DIAGNOSIS — A0839 Other viral enteritis: Secondary | ICD-10-CM | POA: Diagnosis present

## 2020-05-23 DIAGNOSIS — J440 Chronic obstructive pulmonary disease with acute lower respiratory infection: Secondary | ICD-10-CM | POA: Diagnosis present

## 2020-05-23 DIAGNOSIS — Z800 Family history of malignant neoplasm of digestive organs: Secondary | ICD-10-CM | POA: Diagnosis not present

## 2020-05-23 DIAGNOSIS — R739 Hyperglycemia, unspecified: Secondary | ICD-10-CM | POA: Diagnosis not present

## 2020-05-23 LAB — CBC WITH DIFFERENTIAL/PLATELET
Abs Immature Granulocytes: 0.14 10*3/uL — ABNORMAL HIGH (ref 0.00–0.07)
Abs Immature Granulocytes: 0.06 10*3/uL (ref 0.00–0.07)
Basophils Absolute: 0 10*3/uL (ref 0.0–0.1)
Basophils Absolute: 0 10*3/uL (ref 0.0–0.1)
Basophils Relative: 0 %
Basophils Relative: 0 %
Eosinophils Absolute: 0 10*3/uL (ref 0.0–0.5)
Eosinophils Absolute: 0 10*3/uL (ref 0.0–0.5)
Eosinophils Relative: 0 %
Eosinophils Relative: 0 %
HCT: 40.4 % (ref 39.0–52.0)
HCT: 41.3 % (ref 39.0–52.0)
Hemoglobin: 13.5 g/dL (ref 13.0–17.0)
Hemoglobin: 13.6 g/dL (ref 13.0–17.0)
Immature Granulocytes: 1 %
Immature Granulocytes: 1 %
Lymphocytes Relative: 7 %
Lymphocytes Relative: 9 %
Lymphs Abs: 0.7 10*3/uL (ref 0.7–4.0)
Lymphs Abs: 0.7 10*3/uL (ref 0.7–4.0)
MCH: 29.3 pg (ref 26.0–34.0)
MCH: 28.9 pg (ref 26.0–34.0)
MCHC: 33.4 g/dL (ref 30.0–36.0)
MCHC: 32.9 g/dL (ref 30.0–36.0)
MCV: 87.8 fL (ref 80.0–100.0)
MCV: 87.9 fL (ref 80.0–100.0)
Monocytes Absolute: 0.8 10*3/uL (ref 0.1–1.0)
Monocytes Absolute: 0.4 10*3/uL (ref 0.1–1.0)
Monocytes Relative: 8 %
Monocytes Relative: 5 %
Neutro Abs: 8.2 10*3/uL — ABNORMAL HIGH (ref 1.7–7.7)
Neutro Abs: 6.4 10*3/uL (ref 1.7–7.7)
Neutrophils Relative %: 84 %
Neutrophils Relative %: 85 %
Platelets: 231 10*3/uL (ref 150–400)
Platelets: 254 10*3/uL (ref 150–400)
RBC: 4.6 MIL/uL (ref 4.22–5.81)
RBC: 4.7 MIL/uL (ref 4.22–5.81)
RDW: 14.6 % (ref 11.5–15.5)
RDW: 14.6 % (ref 11.5–15.5)
WBC: 9.8 10*3/uL (ref 4.0–10.5)
WBC: 7.5 10*3/uL (ref 4.0–10.5)
nRBC: 0 % (ref 0.0–0.2)
nRBC: 0 % (ref 0.0–0.2)

## 2020-05-23 LAB — COMPREHENSIVE METABOLIC PANEL
ALT: 52 U/L — ABNORMAL HIGH (ref 0–44)
AST: 53 U/L — ABNORMAL HIGH (ref 15–41)
Albumin: 3.3 g/dL — ABNORMAL LOW (ref 3.5–5.0)
Alkaline Phosphatase: 46 U/L (ref 38–126)
Anion gap: 11 (ref 5–15)
BUN: 26 mg/dL — ABNORMAL HIGH (ref 8–23)
CO2: 23 mmol/L (ref 22–32)
Calcium: 8.3 mg/dL — ABNORMAL LOW (ref 8.9–10.3)
Chloride: 104 mmol/L (ref 98–111)
Creatinine, Ser: 1.2 mg/dL (ref 0.61–1.24)
GFR, Estimated: >60 mL/min (ref >60)
Glucose, Bld: 122 mg/dL — ABNORMAL HIGH (ref 70–99)
Potassium: 3.9 mmol/L (ref 3.5–5.1)
Sodium: 138 mmol/L (ref 135–145)
Total Bilirubin: 1.4 mg/dL — ABNORMAL HIGH (ref 0.3–1.2)
Total Protein: 6.6 g/dL (ref 6.5–8.1)

## 2020-05-23 LAB — RESP PANEL BY RT-PCR (FLU A&B, COVID) ARPGX2
Influenza A by PCR: NEGATIVE
Influenza B by PCR: NEGATIVE
SARS Coronavirus 2 by RT PCR: POSITIVE — AB

## 2020-05-23 LAB — C-REACTIVE PROTEIN: CRP: 7.5 mg/dL — ABNORMAL HIGH (ref <1.0)

## 2020-05-23 LAB — TRIGLYCERIDES: Triglycerides: 152 mg/dL — ABNORMAL HIGH (ref <150)

## 2020-05-23 LAB — FIBRINOGEN: Fibrinogen: 604 mg/dL — ABNORMAL HIGH (ref 210–475)

## 2020-05-23 LAB — HIV ANTIBODY (ROUTINE TESTING W REFLEX): HIV Screen 4th Generation wRfx: NONREACTIVE

## 2020-05-23 LAB — D-DIMER, QUANTITATIVE: D-Dimer, Quant: 0.79 ug/mL-FEU — ABNORMAL HIGH (ref 0.00–0.50)

## 2020-05-23 LAB — LACTIC ACID, PLASMA: Lactic Acid, Venous: 1.3 mmol/L (ref 0.5–1.9)

## 2020-05-23 LAB — LACTATE DEHYDROGENASE: LDH: 519 U/L — ABNORMAL HIGH (ref 98–192)

## 2020-05-23 LAB — PROCALCITONIN: Procalcitonin: 0.15 ng/mL

## 2020-05-23 LAB — FERRITIN: Ferritin: 1268 ng/mL — ABNORMAL HIGH (ref 24–336)

## 2020-05-23 MED ORDER — ZINC SULFATE 220 (50 ZN) MG PO CAPS
220.0000 mg | ORAL_CAPSULE | Freq: Every day | ORAL | Status: DC
  Administered 2020-05-23 – 2020-05-30 (×8): 220 mg via ORAL
  Filled 2020-05-23 (×8): qty 1

## 2020-05-23 MED ORDER — ALBUTEROL SULFATE HFA 108 (90 BASE) MCG/ACT IN AERS
2.0000 | INHALATION_SPRAY | RESPIRATORY_TRACT | Status: DC | PRN
  Administered 2020-05-24: 2 via RESPIRATORY_TRACT
  Filled 2020-05-23: qty 6.7

## 2020-05-23 MED ORDER — PREDNISONE 50 MG PO TABS: 50.0000 mg | ORAL_TABLET | Freq: Every day | ORAL | Status: DC

## 2020-05-23 MED ORDER — ACETAMINOPHEN 325 MG PO TABS
650.0000 mg | ORAL_TABLET | Freq: Four times a day (QID) | ORAL | Status: DC | PRN
  Administered 2020-05-28: 650 mg via ORAL
  Filled 2020-05-23 (×2): qty 2

## 2020-05-23 MED ORDER — ONDANSETRON HCL 4 MG/2ML IJ SOLN: 4.0000 mg | Freq: Four times a day (QID) | INTRAMUSCULAR | Status: DC | PRN

## 2020-05-23 MED ORDER — PREDNISONE 20 MG PO TABS: 50.0000 mg | ORAL_TABLET | Freq: Every day | ORAL | Status: DC

## 2020-05-23 MED ORDER — ASCORBIC ACID 500 MG PO TABS
500.0000 mg | ORAL_TABLET | Freq: Every day | ORAL | Status: DC
  Administered 2020-05-23 – 2020-05-30 (×8): 500 mg via ORAL
  Filled 2020-05-23 (×8): qty 1

## 2020-05-23 MED ORDER — METHYLPREDNISOLONE SODIUM SUCC 125 MG IJ SOLR
50.0000 mg | Freq: Four times a day (QID) | INTRAMUSCULAR | Status: AC
  Administered 2020-05-23 – 2020-05-26 (×16): 50 mg via INTRAVENOUS
  Filled 2020-05-23 (×15): qty 2

## 2020-05-23 MED ORDER — HYDROCOD POLST-CPM POLST ER 10-8 MG/5ML PO SUER
5.0000 mL | Freq: Two times a day (BID) | ORAL | Status: DC | PRN
  Administered 2020-05-24 – 2020-05-26 (×2): 5 mL via ORAL
  Filled 2020-05-23 (×2): qty 5

## 2020-05-23 MED ORDER — BARICITINIB 2 MG PO TABS
4.0000 mg | ORAL_TABLET | Freq: Every day | ORAL | Status: DC
  Filled 2020-05-23: qty 2

## 2020-05-23 MED ORDER — LACTATED RINGERS IV SOLN: INTRAVENOUS | Status: AC

## 2020-05-23 MED ORDER — SODIUM CHLORIDE 0.9 % IV SOLN: 8.0000 mg/kg | Freq: Once | INTRAVENOUS | Status: DC

## 2020-05-23 MED ORDER — METHYLPREDNISOLONE SODIUM SUCC 125 MG IJ SOLR
50.0000 mg | Freq: Two times a day (BID) | INTRAMUSCULAR | Status: DC
  Filled 2020-05-23: qty 2

## 2020-05-23 MED ORDER — POLYETHYLENE GLYCOL 3350 17 G PO PACK: 17.0000 g | PACK | Freq: Every day | ORAL | Status: DC | PRN

## 2020-05-23 MED ORDER — ENOXAPARIN SODIUM 40 MG/0.4ML ~~LOC~~ SOLN
40.0000 mg | SUBCUTANEOUS | Status: DC
  Administered 2020-05-23 – 2020-05-28 (×6): 40 mg via SUBCUTANEOUS
  Filled 2020-05-23 (×6): qty 0.4

## 2020-05-23 MED ORDER — TOCILIZUMAB 400 MG/20ML IV SOLN
800.0000 mg | INTRAVENOUS | Status: AC
  Administered 2020-05-23: 800 mg via INTRAVENOUS
  Filled 2020-05-23: qty 40

## 2020-05-23 MED ORDER — ONDANSETRON HCL 4 MG PO TABS: 4.0000 mg | ORAL_TABLET | Freq: Four times a day (QID) | ORAL | Status: DC | PRN

## 2020-05-23 NOTE — ED Notes (Addendum)
Pt SpO2 90-93% on 6L [place]. Switched to [employer] at [employer]. SpO2 94%

## 2020-05-23 NOTE — ED Provider Notes (Signed)
Nursing notes and vitals signs, including pulse oximetry, reviewed.  Summary of this visit's results, reviewed by myself:  EKG:  EKG Interpretation  Date/Time:  Friday May 23 2020 00:05:00 EST Ventricular Rate:  78 PR Interval:    QRS Duration: 135 QT Interval:  411 QTC Calculation: 469 R Axis:   19 Text Interpretation: Sinus rhythm Right bundle branch block No previous ECGs available Confirmed by Haliyah Fryman, Jenny Reichmann [phone]) on 05/23/2020 12:12:13 AM       Labs:  Results for orders placed or performed during the hospital encounter of 05/22/20 (from the past 24 hour(s))  Lactic acid, plasma     Status: None   Collection Time: 05/22/20 11:40 PM  Result Value Ref Range   Lactic Acid, Venous 1.3 0.5 - 1.9 mmol/L  CBC WITH DIFFERENTIAL     Status: Abnormal   Collection Time: 05/22/20 11:40 PM  Result Value Ref Range   WBC 9.8 4.0 - 10.5 K/uL   RBC 4.60 4.22 - 5.81 MIL/uL   Hemoglobin 13.5 13.0 - 17.0 g/dL   HCT 40.4 39.0 - 52.0 %   MCV 87.8 80.0 - 100.0 fL   MCH 29.3 26.0 - 34.0 pg   MCHC 33.4 30.0 - 36.0 g/dL   RDW 14.6 11.5 - 15.5 %   Platelets 231 150 - 400 K/uL   nRBC 0.0 0.0 - 0.2 %   Neutrophils Relative % 84 %   Neutro Abs 8.2 (H) 1.7 - 7.7 K/uL   Lymphocytes Relative 7 %   Lymphs Abs 0.7 0.7 - 4.0 K/uL   Monocytes Relative 8 %   Monocytes Absolute 0.8 0.1 - 1.0 K/uL   Eosinophils Relative 0 %   Eosinophils Absolute 0.0 0.0 - 0.5 K/uL   Basophils Relative 0 %   Basophils Absolute 0.0 0.0 - 0.1 K/uL   Immature Granulocytes 1 %   Abs Immature Granulocytes 0.14 (H) 0.00 - 0.07 K/uL   Reactive, Benign Lymphocytes PRESENT   Comprehensive metabolic panel     Status: Abnormal   Collection Time: 05/22/20 11:40 PM  Result Value Ref Range   Sodium 138 135 - 145 mmol/L   Potassium 3.9 3.5 - 5.1 mmol/L   Chloride 104 98 - 111 mmol/L   CO2 23 22 - 32 mmol/L   Glucose, Bld 122 (H) 70 - 99 mg/dL   BUN 26 (H) 8 - 23 mg/dL   Creatinine, Ser 1.20 0.61 - 1.24 mg/dL   Calcium  8.3 (L) 8.9 - 10.3 mg/dL   Total Protein 6.6 6.5 - 8.1 g/dL   Albumin 3.3 (L) 3.5 - 5.0 g/dL   AST 53 (H) 15 - 41 U/L   ALT 52 (H) 0 - 44 U/L   Alkaline Phosphatase 46 38 - 126 U/L   Total Bilirubin 1.4 (H) 0.3 - 1.2 mg/dL   GFR, Estimated >60 >60 mL/min   Anion gap 11 5 - 15  D-dimer, quantitative     Status: Abnormal   Collection Time: 05/22/20 11:40 PM  Result Value Ref Range   D-Dimer, Quant 0.79 (H) 0.00 - 0.50 ug/mL-FEU  Lactate dehydrogenase     Status: Abnormal   Collection Time: 05/22/20 11:40 PM  Result Value Ref Range   LDH 519 (H) 98 - 192 U/L  Ferritin     Status: Abnormal   Collection Time: 05/22/20 11:40 PM  Result Value Ref Range   Ferritin 1,268 (H) 24 - 336 ng/mL  Triglycerides     Status: Abnormal   Collection  Time: 05/22/20 11:40 PM  Result Value Ref Range   Triglycerides 152 (H) <150 mg/dL  Fibrinogen     Status: Abnormal   Collection Time: 05/22/20 11:40 PM  Result Value Ref Range   Fibrinogen 604 (H) 210 - 475 mg/dL  C-reactive protein     Status: Abnormal   Collection Time: 05/22/20 11:40 PM  Result Value Ref Range   CRP 7.5 (H) <1.0 mg/dL    Imaging Studies: DG Chest Port 1 View  Result Date: 05/22/2020 CLINICAL DATA:  Short of breath, COVID-19 EXAM: PORTABLE CHEST 1 VIEW COMPARISON:  None. FINDINGS: 2 frontal views of the chest demonstrate an unremarkable cardiac silhouette. There is diffuse interstitial prominence, with moderate multifocal bilateral ground-glass airspace disease greatest at the lung bases. No effusion or pneumothorax. IMPRESSION: 1. Moderate bilateral multifocal pneumonia, compatible with COVID-19. Electronically Signed   By: Randa Ngo M.D.   On: 05/22/2020 22:31      Janeth Terry, Jenny Reichmann, MD 05/23/20 [phone]

## 2020-05-23 NOTE — Progress Notes (Signed)
Flutter valve not given due to on back order.

## 2020-05-23 NOTE — ED Notes (Signed)
I gave patient his breakfast tray

## 2020-05-23 NOTE — H&P (Addendum)
History and Physical    Jeffrey Abbott NID:[id number] DOB: May 22, 1960 DOA: 05/22/2020  PCP: Chevis Pretty, FNP  Patient coming from: Home   Chief Complaint:  Chief Complaint  Patient presents with  . Covid Positive  . Shortness of Breath     HPI:    60 year old male with no significant past medical history presenting to [facility] emergency department with complaints of cough and shortness of breath.  Patient explains that on January 29 he began to experience generalized malaise, weakness and body aches.  This was associated with sudden onset of poor appetite.  On January 30th the patient took a home Covid test and ended up being positive.  In the days that followed patient attempted to manage his symptoms conservatively by resting at home.  States that as the days progressed he began to develop progressively worsening malaise weakness muscle aches and persisting poor appetite.  Patient also states that over this span of time he experienced intermittent fevers.  In the past 2 to 3 days prior to his presentation today, the patient also began to develop shortness of breath and cough.  Patient describes the cough as nonproductive.  Shortness of breath is worse with exertion and improved with rest.  Due to this progressively worsening constellation of symptoms patient eventually presented to [facility] emergency department for evaluation.  Upon evaluation in the emergency department COVID-19 PCR testing was performed and confirmed to be positive.  Patient was found to be hypoxic on arrival requiring supplemental oxygen and has been titrated upwards and is now on 6 L of oxygen via nasal cannula.  Hospitalist group has now been called to assess the patient for admission to the hospital.  Review of Systems:   Review of Systems  Constitutional: Positive for malaise/fatigue.  Respiratory: Positive for cough and shortness of breath.   Neurological: Positive for  weakness.  All other systems reviewed and are negative.   Past Medical History:  Diagnosis Date  . Allergy   . Hyperlipidemia    no meds  . 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 Umbilical hernia     Past Surgical History:  Procedure Laterality Date  . NASAL SEPTUM SURGERY       reports that he has quit smoking. His smoking use included cigarettes. He has a 25.00 pack-year smoking history. His smokeless tobacco use includes chew. He reports that he does not drink alcohol and does not use drugs.  Allergies  Allergen Reactions  . Niaspan [Niacin Er]     Joint pain    Family History  Problem Relation Age of Onset  . Asthma Mother   . Hypertension Father   . Peripheral vascular disease Father 9  . Coronary artery disease Father 48  . Colon cancer Maternal Aunt   . Colon cancer Maternal Grandmother        possibly started in colon, traveled to stomach  . Stomach cancer Maternal Grandmother   . Esophageal cancer Neg Hx   . Rectal cancer Neg Hx      Prior to Admission medications   Medication Sig Start Date End Date Taking? Authorizing Provider  azithromycin (ZITHROMAX) 250 MG tablet 2 tablets day 1, 1 tablet day 2-5 05/20/20   Ivy Lynn, NP  benzonatate (TESSALON PERLES) 100 MG capsule Take 1 capsule (100 mg total) by mouth 3 (three) times daily as needed for cough. 05/20/20   Ivy Lynn,  NP  ondansetron (ZOFRAN) 4 MG tablet Take 1 tablet (4 mg total) by mouth every 8 (eight) hours as needed for nausea or vomiting. 05/20/20   Ivy Lynn, NP    Physical Exam: Vitals:   05/23/20 0130 05/23/20 0200 05/23/20 0230 05/23/20 0300  BP: 115/73 118/76 120/74 121/78  Pulse: 74 74 71 71  Resp: (!) 26 (!) 22 (!) 24 (!) 22  Temp:      TempSrc:      SpO2: 96% 92% 94% 95%     Constitutional: Acute alert and oriented x3, mild respiratory distress.   Skin: no rashes, no lesions, poor skin turgor noted. Eyes: Pupils  are equally reactive to light.  No evidence of scleral icterus or conjunctival pallor.  ENMT: Dry mucous membranes noted.  Posterior pharynx clear of any exudate or lesions.   Neck: normal, supple, no masses, no thyromegaly.  No evidence of jugular venous distension.   Respiratory: Notable rales in the bilateral mid and lower fields.  No evidence of significant wheezing.  Patient is somewhat tachypneic without evidence of accessory muscle use.  Cardiovascular: Regular rate and rhythm, no murmurs / rubs / gallops. No extremity edema. 2+ pedal pulses. No carotid bruits.  Chest:   Nontender without crepitus or deformity.   Back:   Nontender without crepitus or deformity. Abdomen: Abdomen is soft and nontender.  No evidence of intra-abdominal masses.  Positive bowel sounds noted in all quadrants.   Musculoskeletal: No joint deformity upper and lower extremities. Good ROM, no contractures. Normal muscle tone.  Neurologic: CN 2-12 grossly intact. Sensation intact.  Patient moving all 4 extremities spontaneously.  Patient is following all commands.  Patient is responsive to verbal stimuli.   Psychiatric: Patient exhibits normal mood with appropriate affect.  Patient seems to possess insight as to their current situation.     Labs on Admission: I have personally reviewed following labs and imaging studies -   CBC: Recent Labs  Lab 05/22/20 2340  WBC 9.8  NEUTROABS 8.2*  HGB 13.5  HCT 40.4  MCV 87.8  PLT 962   Basic Metabolic Panel: Recent Labs  Lab 05/22/20 2340  NA 138  K 3.9  CL 104  CO2 23  GLUCOSE 122*  BUN 26*  CREATININE 1.20  CALCIUM 8.3*   GFR: CrCl cannot be calculated (Unknown ideal weight.). Liver Function Tests: Recent Labs  Lab 05/22/20 2340  AST 53*  ALT 52*  ALKPHOS 46  BILITOT 1.4*  PROT 6.6  ALBUMIN 3.3*   No results for input(s): LIPASE, AMYLASE in the last 168 hours. No results for input(s): AMMONIA in the last 168 hours. Coagulation Profile: No  results for input(s): INR, PROTIME in the last 168 hours. Cardiac Enzymes: No results for input(s): CKTOTAL, CKMB, CKMBINDEX, TROPONINI in the last 168 hours. BNP (last 3 results) No results for input(s): PROBNP in the last 8760 hours. HbA1C: No results for input(s): HGBA1C in the last 72 hours. CBG: No results for input(s): GLUCAP in the last 168 hours. Lipid Profile: Recent Labs    05/22/20 2340  TRIG 152*   Thyroid Function Tests: No results for input(s): TSH, T4TOTAL, FREET4, T3FREE, THYROIDAB in the last 72 hours. Anemia Panel: Recent Labs    05/22/20 2340  FERRITIN 1,268*   Urine analysis: No results found for: COLORURINE, APPEARANCEUR, LABSPEC, PHURINE, GLUCOSEU, HGBUR, BILIRUBINUR, KETONESUR, PROTEINUR, UROBILINOGEN, NITRITE, LEUKOCYTESUR  Radiological Exams on Admission - Personally Reviewed: DG Chest Port 1 View  Result Date: 05/22/2020 CLINICAL  DATA:  Short of breath, COVID-19 EXAM: PORTABLE CHEST 1 VIEW COMPARISON:  None. FINDINGS: 2 frontal views of the chest demonstrate an unremarkable cardiac silhouette. There is diffuse interstitial prominence, with moderate multifocal bilateral ground-glass airspace disease greatest at the lung bases. No effusion or pneumothorax. IMPRESSION: 1. Moderate bilateral multifocal pneumonia, compatible with COVID-19. Electronically Signed   By: Randa Ngo M.D.   On: 05/22/2020 22:31    EKG: Personally reviewed.  Rhythm is sinus rhythm with heart rate of 78 bpm with evidence of right bundle branch block.  No dynamic ST segment changes appreciated.  Assessment/Plan Principal Problem:   COVID-19 virus infection  Patient presenting with approximately 2 weeks of generalized malaise, weakness, poor appetite and a several day history of increasingly worsening shortness of breath and cough.  COVID-19 home test came back positive on 1/30 and has been confirmed to be positive via PCR testing today.  Chest x-ray reveals patchy bilateral  infiltrates consistent with Covid pneumonia.  Patient is additionally exhibiting substantial hypoxic respiratory failure requiring increasing amounts of submental oxygen here in the emergency department, now on 6 L via nasal cannula.  If oxygen requirements increase further will likely transition patient to [place]ter high flow oxygen delivery.  Due to gradually increasing oxygen requirements here in the emergency department we will treat patient with a combination of intravenous Solu-Medrol and baricitinib.    Since patient is nearly 2 weeks out from onset of symptoms I do not believe that remdesivir will be of clinical benefit to this patient.  Procalcitonin is essentially normal making bacterial coinfection extremely unlikely.  As needed bronchodilator therapy, as needed antitussives  Zinc and vitamin C supplementation  Hydrating patient with intravenous isotonic fluids  Airborne and contact isolation -using 21-day isolation protocol considering continued hypoxia and symptoms  Active Problems:   Acute respiratory failure with hypoxia (HCC)  Please see assessment and plan above.    Code Status:  Full code Family Communication: deferred   Status is: Observation  The patient remains OBS appropriate and will d/c before 2 midnights.  Dispo: The patient is from: Home              Anticipated d/c is to: Home              Anticipated d/c date is: 2 days              Patient currently is not medically stable to d/c.   Difficult to place patient No        Vernelle Emerald MD [facility] Pager [phone]  If 7PM-7AM, please contact night-coverage [email or web address] Use universal [place] password for that web site. If you do not have the password, please call the hospital operator.  05/23/2020, 4:48 AM

## 2020-05-23 NOTE — Progress Notes (Signed)
Agree with plan of care and history and physical as per my partner Dr. Cyd Silence who admitted this patient earlier this morning  59 year old white male chronic RBBB, chronic tobacco, cerumen impactions Seen at primary care practice by televisit 05/20/2020 with upper respiratory symptoms cough headache nausea sore throat-advised symptomatic management with hydration Tessalon Perles and given azithromycin prescription Took home Covid test apparently 05/11/2020 which was positive Found to be hypoxic and requiring 6 L nasal cannula  Has developed progressive worsening hypoxia and now on 12 L high flow nasal cannula up from 6 L at baseline when he came in  He does not smoke anymore-is relatively high functioning typically  He lives alone tells me his daughter is his next of kin  I have explained to him that [place] is a new disease and as such there is no proven specific therapies however he may benefit from certain experimental therapy such as Actemra-I have reviewed with him that he does not have any contraindication only diverticulosis which is inactive and was found on routine colonoscopy in 2018  We will start him on Actemra, continue steroids supportive measures and cycle his inflammatory markers tomorrow  I updated his daughter [name] [phone] in detail.  Verneita Griffes, MD [facility] 9:06 AM

## 2020-05-24 LAB — CBC WITH DIFFERENTIAL/PLATELET
Abs Immature Granulocytes: 0.07 10*3/uL (ref 0.00–0.07)
Basophils Absolute: 0 10*3/uL (ref 0.0–0.1)
Basophils Relative: 0 %
Eosinophils Absolute: 0 10*3/uL (ref 0.0–0.5)
Eosinophils Relative: 0 %
HCT: 38.7 % — ABNORMAL LOW (ref 39.0–52.0)
Hemoglobin: 12.9 g/dL — ABNORMAL LOW (ref 13.0–17.0)
Immature Granulocytes: 1 %
Lymphocytes Relative: 11 %
Lymphs Abs: 1 10*3/uL (ref 0.7–4.0)
MCH: 29.8 pg (ref 26.0–34.0)
MCHC: 33.3 g/dL (ref 30.0–36.0)
MCV: 89.4 fL (ref 80.0–100.0)
Monocytes Absolute: 0.7 10*3/uL (ref 0.1–1.0)
Monocytes Relative: 7 %
Neutro Abs: 7.6 10*3/uL (ref 1.7–7.7)
Neutrophils Relative %: 81 %
Platelets: 285 10*3/uL (ref 150–400)
RBC: 4.33 MIL/uL (ref 4.22–5.81)
RDW: 14.6 % (ref 11.5–15.5)
WBC: 9.4 10*3/uL (ref 4.0–10.5)
nRBC: 0 % (ref 0.0–0.2)

## 2020-05-24 LAB — COMPREHENSIVE METABOLIC PANEL
ALT: 50 U/L — ABNORMAL HIGH (ref 0–44)
AST: 42 U/L — ABNORMAL HIGH (ref 15–41)
Albumin: 2.7 g/dL — ABNORMAL LOW (ref 3.5–5.0)
Alkaline Phosphatase: 40 U/L (ref 38–126)
Anion gap: 10 (ref 5–15)
BUN: 30 mg/dL — ABNORMAL HIGH (ref 8–23)
CO2: 25 mmol/L (ref 22–32)
Calcium: 8.4 mg/dL — ABNORMAL LOW (ref 8.9–10.3)
Chloride: 106 mmol/L (ref 98–111)
Creatinine, Ser: 0.89 mg/dL (ref 0.61–1.24)
GFR, Estimated: >60 mL/min (ref >60)
Glucose, Bld: 140 mg/dL — ABNORMAL HIGH (ref 70–99)
Potassium: 4.3 mmol/L (ref 3.5–5.1)
Sodium: 141 mmol/L (ref 135–145)
Total Bilirubin: 0.8 mg/dL (ref 0.3–1.2)
Total Protein: 5.9 g/dL — ABNORMAL LOW (ref 6.5–8.1)

## 2020-05-24 LAB — MAGNESIUM: Magnesium: 2.4 mg/dL (ref 1.7–2.4)

## 2020-05-24 LAB — C-REACTIVE PROTEIN: CRP: 7.9 mg/dL — ABNORMAL HIGH (ref <1.0)

## 2020-05-24 LAB — D-DIMER, QUANTITATIVE: D-Dimer, Quant: 0.47 ug/mL-FEU (ref 0.00–0.50)

## 2020-05-24 NOTE — Progress Notes (Signed)
PROGRESS NOTE   Jeffrey Abbott  OEU:[id number] DOB: Aug 20, 1960 DOA: 05/22/2020 PCP: Chevis Pretty, FNP  Brief Narrative:  60 year old white male chronic RBBB, chronic tobacco, cerumen impactions Seen at primary care practice by televisit 05/20/2020 with upper respiratory symptoms cough headache nausea sore throat-advised symptomatic management with hydration Tessalon Perles and given azithromycin prescription Took home Covid test apparently 05/11/2020 which was positive Found to be hypoxic and requiring 6 L nasal cannula  Has developed progressive worsening hypoxia and now on 12 L high flow nasal cannula up from 6 L at baseline when he came in  He does not smoke anymore-is relatively high functioning typically  He lives alone tells me his daughter is his next of kin    Assessment & Plan:   Principal Problem:   COVID-19 virus infection Active Problems:   Acute respiratory failure with hypoxia ([place])   1. Acute hypoxic respiratory failure secondary to coronavirus a. Inflammatory markers are down marginally-given 1 dose of Actemra on day of admission b. Requiring up to 12 L at times of nasal cannula-aim for sats 85-90 does not need to be 95%  c. Ambulate as best possible d. Prone 16 hours a day e. Expect protracted course and slow recovery and have explained this to patient/family 2. Prior tobacco or suspected COPD a. Administer albuterol, Tussionex and would continue at this time Solu-Medrol every 6 hours 3. Chronic RBBB a. Stable at this time  DVT prophylaxis: Prophylactic Lovenox Code Status: Full code Family Communication: Called and discussed with daughter [place] Disposition:  Status is: Inpatient  Remains inpatient appropriate because:IV treatments appropriate due to intensity of illness or inability to take PO   Dispo: The patient is from: Home              Anticipated d/c is to: Home              Anticipated d/c date is: > 3 days               Patient currently is not medically stable to d/c.   Difficult to place patient No       Consultants:   None yet  Procedures: No  Antimicrobials: None   Subjective: Fair Increased WOB with sitting up and desat overnight   Objective: Vitals:   05/23/20 1900 05/23/20 2230 05/24/20 0218 05/24/20 0647  BP:  121/72 117/67 121/82  Pulse:  77 70 71  Resp:  20 20 (!) 22  Temp:  97.6 F (36.4 C) 97.7 F (36.5 C) 97.7 F (36.5 C)  TempSrc:  Oral    SpO2:  91% (!) 89% (!) 89%  Weight: 102.5 kg     Height: 5\' 7"  (1.702 m)       Intake/Output Summary (Last 24 hours) at 05/24/2020 0908 Last data filed at 05/24/2020 0400 Gross per 24 hour  Intake 625 ml  Output 550 ml  Net 75 ml   Filed Weights   05/23/20 0821 05/23/20 1900  Weight: 102.1 kg 102.5 kg    Examination:  Awake coherent no distress Increased work of breathing with sitting Chest clinically clear no added sound rales rhonchi Abdomen soft no rebound no guarding No lower extremity edema Neurologically intact   Data Reviewed: I have personally reviewed following labs and imaging studies Sodium 141 potassium 3.4 BUNs/creatinine 26/1.2-->30/0.8 LFTs stable Hemoglobin 12.9  COVID-19 Labs  Recent Labs    05/22/20 2340 05/24/20 0349  DDIMER 0.79* 0.47  FERRITIN 1,268*  --  LDH 519*  --   CRP 7.5* 7.9*    Lab Results  Component Value Date   SARSCOV2NAA POSITIVE (A) 05/23/2020     Radiology Studies: [facility] Chest Port 1 View  Result Date: 05/22/2020 CLINICAL DATA:  Short of breath, COVID-19 EXAM: PORTABLE CHEST 1 VIEW COMPARISON:  None. FINDINGS: 2 frontal views of the chest demonstrate an unremarkable cardiac silhouette. There is diffuse interstitial prominence, with moderate multifocal bilateral ground-glass airspace disease greatest at the lung bases. No effusion or pneumothorax. IMPRESSION: 1. Moderate bilateral multifocal pneumonia, compatible with COVID-19. Electronically Signed   By: Randa Ngo M.D.   On: 05/22/2020 22:31     Scheduled Meds: . vitamin C  500 mg Oral Daily  . enoxaparin (LOVENOX) injection  40 mg Subcutaneous Q24H  . methylPREDNISolone (SOLU-MEDROL) injection  50 mg Intravenous Q6H   Followed by  . [START ON 05/27/2020] predniSONE  50 mg Oral Daily  . zinc sulfate  220 mg Oral Daily  None Continuous Infusions:   LOS: 1 day    Time spent: 25  Nita Sells, MD [facility] To contact the attending provider between 7A-7P or the covering provider during after hours 7P-7A, please log into the web site [email or web address] and access using universal [place] password for that web site. If you do not have the password, please call the hospital operator.  05/24/2020, 9:08 AM

## 2020-05-24 NOTE — Progress Notes (Signed)
0230- Pts O2 sats on 10L HFNC = 81% ---- increased to 12LHFNC  0400- Off O2 to stand at [facility] using urinal & O2sats 77%---- increased to 15LHFNC, given tussionex & albuterol inhaler 2 puffs, used incentive spiro   0430- O2 sats still 81% on 15LHFNC-----added 10L NRB  ---- O2 sats now 90%

## 2020-05-25 LAB — COMPREHENSIVE METABOLIC PANEL
ALT: 60 U/L — ABNORMAL HIGH (ref 0–44)
AST: 44 U/L — ABNORMAL HIGH (ref 15–41)
Albumin: 2.8 g/dL — ABNORMAL LOW (ref 3.5–5.0)
Alkaline Phosphatase: 40 U/L (ref 38–126)
Anion gap: 11 (ref 5–15)
BUN: 38 mg/dL — ABNORMAL HIGH (ref 8–23)
CO2: 23 mmol/L (ref 22–32)
Calcium: 8.4 mg/dL — ABNORMAL LOW (ref 8.9–10.3)
Chloride: 109 mmol/L (ref 98–111)
Creatinine, Ser: 1.03 mg/dL (ref 0.61–1.24)
GFR, Estimated: >60 mL/min (ref >60)
Glucose, Bld: 151 mg/dL — ABNORMAL HIGH (ref 70–99)
Potassium: 4.3 mmol/L (ref 3.5–5.1)
Sodium: 143 mmol/L (ref 135–145)
Total Bilirubin: 1.1 mg/dL (ref 0.3–1.2)
Total Protein: 5.8 g/dL — ABNORMAL LOW (ref 6.5–8.1)

## 2020-05-25 LAB — CBC WITH DIFFERENTIAL/PLATELET
Abs Immature Granulocytes: 0.11 10*3/uL — ABNORMAL HIGH (ref 0.00–0.07)
Basophils Absolute: 0 10*3/uL (ref 0.0–0.1)
Basophils Relative: 0 %
Eosinophils Absolute: 0 10*3/uL (ref 0.0–0.5)
Eosinophils Relative: 0 %
HCT: 41 % (ref 39.0–52.0)
Hemoglobin: 13.1 g/dL (ref 13.0–17.0)
Immature Granulocytes: 1 %
Lymphocytes Relative: 7 %
Lymphs Abs: 1 10*3/uL (ref 0.7–4.0)
MCH: 28.7 pg (ref 26.0–34.0)
MCHC: 32 g/dL (ref 30.0–36.0)
MCV: 89.9 fL (ref 80.0–100.0)
Monocytes Absolute: 0.9 10*3/uL (ref 0.1–1.0)
Monocytes Relative: 6 %
Neutro Abs: 12.4 10*3/uL — ABNORMAL HIGH (ref 1.7–7.7)
Neutrophils Relative %: 86 %
Platelets: 327 10*3/uL (ref 150–400)
RBC: 4.56 MIL/uL (ref 4.22–5.81)
RDW: 14.5 % (ref 11.5–15.5)
WBC: 14.4 10*3/uL — ABNORMAL HIGH (ref 4.0–10.5)
nRBC: 0 % (ref 0.0–0.2)

## 2020-05-25 LAB — MAGNESIUM: Magnesium: 2.6 mg/dL — ABNORMAL HIGH (ref 1.7–2.4)

## 2020-05-25 LAB — C-REACTIVE PROTEIN: CRP: 2.9 mg/dL — ABNORMAL HIGH (ref <1.0)

## 2020-05-25 LAB — D-DIMER, QUANTITATIVE: D-Dimer, Quant: 0.64 ug/mL-FEU — ABNORMAL HIGH (ref 0.00–0.50)

## 2020-05-25 MED ORDER — SODIUM CHLORIDE 0.9 % IV SOLN: INTRAVENOUS | Status: DC

## 2020-05-25 MED ORDER — LIP MEDEX EX OINT
TOPICAL_OINTMENT | CUTANEOUS | Status: DC | PRN
  Filled 2020-05-25: qty 7

## 2020-05-25 NOTE — Progress Notes (Signed)
Patient removed his oxygen and went to the bathroom. O2 Saturation dropped to 78 percent. Patient was placed on 10L HFNC and 10L Non-rebreather to bring the O2 saturation upto 90 percent. After 5 minutes patient was placed on 13 L High Flow, O2 saturation between 85 and 90 percent.

## 2020-05-25 NOTE — Progress Notes (Signed)
PROGRESS NOTE   Jeffrey Abbott  NGE:[id number] DOB: 12/12/1960 DOA: 05/22/2020 PCP: Chevis Pretty, FNP  Brief Narrative:  60 year old white male chronic RBBB, chronic tobacco, cerumen impactions Seen at primary care practice by televisit 05/20/2020 with upper respiratory symptoms cough headache nausea sore throat-advised symptomatic management with hydration Tessalon Perles and given azithromycin prescription Took home Covid test apparently 05/11/2020 which was positive Found to be hypoxic and requiring 6 L nasal cannula  Has developed progressive worsening hypoxia and now on 12 L high flow nasal cannula up from 6 L at baseline when he came in  He does not smoke anymore-is relatively high functioning typically  He lives alone tells me his daughter is his next of kin    Assessment & Plan:   Principal Problem:   COVID-19 virus infection Active Problems:   Acute respiratory failure with hypoxia ([place])   1. Acute hypoxic respiratory failure secondary to coronavirus a. Inflammatory markers are down marginally-given 1 dose of Actemra on day of admission b. Wean oxygen as tolerated aim for sats 85-90 does not need to be 95%-will need protracted long hospital stay until oxygen requirements are less than 6 L c. Ambulate as best possible d. Prone 16 hours a day 2. Diarrhea not otherwise specified a. Get C. difficile and fecal lactoferrin-if negative may be able to give Imodium to slow down stool b. MiraLAX has been ordered but not given since admission so monitor 3. Prior tobacco or suspected COPD a. Administer albuterol, Tussionex and would continue at this time Solu-Medrol every 6 hours 4. Leukocytosis a. Likely secondary to Solu-Medrol as above b. Since no fever or chills recently we will hold on working up for other infectious sources 5. Mild AKI a. Creatinine rising slightly b. Start saline at 75 cc an hour given probable increased insensible losses 6. Chronic  RBBB a. Stable at this time  DVT prophylaxis: Prophylactic Lovenox Code Status: Full code Family Communication: No family present today-have discussed on 2/13 with daughter [phone] [name] Disposition:  Status is: Inpatient  Remains inpatient appropriate because:IV treatments appropriate due to intensity of illness or inability to take PO   Dispo: The patient is from: Home              Anticipated d/c is to: Home              Anticipated d/c date is: > 3 days              Patient currently is not medically stable to d/c.   Difficult to place patient No       Consultants:   None yet  Procedures: No  Antimicrobials: None   Subjective:  Several bouts of diarrhea over the course of yesterday and today Some confusion at times No fever no chills Asking for Imodium Also has FMLA paperwork he wants me to fill out  Objective: Vitals:   05/25/20 0210 05/25/20 0220 05/25/20 0435 05/25/20 0600  BP:   (!) 134/93   Pulse:   70   Resp:   20   Temp:   97.7 F (36.5 C)   TempSrc:   Oral   SpO2: (!) 88% (!) 88% 94% (!) 89%  Weight:      Height:        Intake/Output Summary (Last 24 hours) at 05/25/2020 0957 Last data filed at 05/24/2020 2300 Gross per 24 hour  Intake 240 ml  Output 575 ml  Net -335 ml   [employer]  05/23/20 0821 05/23/20 1900  Weight: 102.1 kg 102.5 kg    Examination:  EOMI NCAT no focal deficit on high flow oxygen CTA B no added sound no rales rhonchi Abdomen soft no rebound no guarding No lower extremity edema ROM intact   Data Reviewed: I have personally reviewed following labs and imaging studies Sodium 143 potassium 4.3 BUNs/creatinine 26/1.2--> 38/1.0 LFTs stable Hemoglobin 12.9-->14.4  COVID-19 Labs  Recent Labs    05/22/20 2340 05/24/20 0349 05/25/20 0316  DDIMER 0.79* 0.47 0.64*  FERRITIN 1,268*  --   --   LDH 519*  --   --   CRP 7.5* 7.9* 2.9*    Lab Results  Component Value Date   SARSCOV2NAA POSITIVE (A)  05/23/2020     Radiology Studies: No results found.   Scheduled Meds: . vitamin C  500 mg Oral Daily  . enoxaparin (LOVENOX) injection  40 mg Subcutaneous Q24H  . methylPREDNISolone (SOLU-MEDROL) injection  50 mg Intravenous Q6H  . zinc sulfate  220 mg Oral Daily  None Continuous Infusions:   LOS: 2 days    Time spent: 20  Nita Sells, MD [facility] To contact the attending provider between 7A-7P or the covering provider during after hours 7P-7A, please log into the web site [email or web address] and access using universal [place] password for that web site. If you do not have the password, please call the hospital operator.  05/25/2020, 9:57 AM

## 2020-05-25 NOTE — Progress Notes (Signed)
   05/25/20 1356  Assess: MEWS Score  Temp 97.6 F (36.4 C)  BP (!) 144/84  Pulse Rate 69  ECG Heart Rate 73  Resp (!) 38  Level of Consciousness Alert  SpO2 (!) 89 %  O2 Device HFNC  O2 Flow Rate (L/min) 12 L/min  Assess: MEWS Score  MEWS Temp 0  MEWS Systolic 0  MEWS Pulse 0  MEWS RR 3  MEWS LOC 0  MEWS Score 3  MEWS Score Color Yellow  Assess: if the MEWS score is Yellow or Red  Were vital signs taken at a resting state? Yes  Focused Assessment No change from prior assessment  Early Detection of Sepsis Score *See Row Information* Low  MEWS guidelines implemented *See Row Information* Yes  Treat  MEWS Interventions Administered scheduled meds/treatments  Pain Scale 0-10  Pain Score 0  Take Vital Signs  Increase Vital Sign Frequency  Yellow: Q 2hr X 2 then Q 4hr X 2, if remains yellow, continue Q 4hrs  Escalate  MEWS: Escalate Yellow: discuss with charge nurse/RN and consider discussing with provider and RRT  Notify: Charge Nurse/RN  Name of Charge Nurse/RN Notified Wendy, RN  Date Charge Nurse/RN Notified 05/25/20  Time Charge Nurse/RN Notified 1400   Pt is in the Yellow MEWS d/t increased RR of 38. RN increased HFNC to 12L and encouraged the pt to perform deep breathing exercises to promote better oxygenation. Agricultural consultant notified. Yellow MEWS guidelines implemented.

## 2020-05-26 LAB — CBC WITH DIFFERENTIAL/PLATELET
Abs Immature Granulocytes: 0.33 10*3/uL — ABNORMAL HIGH (ref 0.00–0.07)
Basophils Absolute: 0 10*3/uL (ref 0.0–0.1)
Basophils Relative: 0 %
Eosinophils Absolute: 0 10*3/uL (ref 0.0–0.5)
Eosinophils Relative: 0 %
HCT: 41.3 % (ref 39.0–52.0)
Hemoglobin: 13.5 g/dL (ref 13.0–17.0)
Immature Granulocytes: 2 %
Lymphocytes Relative: 8 %
Lymphs Abs: 1.2 10*3/uL (ref 0.7–4.0)
MCH: 29.7 pg (ref 26.0–34.0)
MCHC: 32.7 g/dL (ref 30.0–36.0)
MCV: 90.8 fL (ref 80.0–100.0)
Monocytes Absolute: 0.9 10*3/uL (ref 0.1–1.0)
Monocytes Relative: 6 %
Neutro Abs: 12.2 10*3/uL — ABNORMAL HIGH (ref 1.7–7.7)
Neutrophils Relative %: 84 %
Platelets: 360 10*3/uL (ref 150–400)
RBC: 4.55 MIL/uL (ref 4.22–5.81)
RDW: 14.5 % (ref 11.5–15.5)
WBC: 14.6 10*3/uL — ABNORMAL HIGH (ref 4.0–10.5)
nRBC: 0 % (ref 0.0–0.2)

## 2020-05-26 LAB — COMPREHENSIVE METABOLIC PANEL
ALT: 102 U/L — ABNORMAL HIGH (ref 0–44)
AST: 59 U/L — ABNORMAL HIGH (ref 15–41)
Albumin: 2.7 g/dL — ABNORMAL LOW (ref 3.5–5.0)
Alkaline Phosphatase: 42 U/L (ref 38–126)
Anion gap: 9 (ref 5–15)
BUN: 40 mg/dL — ABNORMAL HIGH (ref 8–23)
CO2: 22 mmol/L (ref 22–32)
Calcium: 8 mg/dL — ABNORMAL LOW (ref 8.9–10.3)
Chloride: 111 mmol/L (ref 98–111)
Creatinine, Ser: 0.95 mg/dL (ref 0.61–1.24)
GFR, Estimated: >60 mL/min (ref >60)
Glucose, Bld: 143 mg/dL — ABNORMAL HIGH (ref 70–99)
Potassium: 4.6 mmol/L (ref 3.5–5.1)
Sodium: 142 mmol/L (ref 135–145)
Total Bilirubin: 1.2 mg/dL (ref 0.3–1.2)
Total Protein: 5.8 g/dL — ABNORMAL LOW (ref 6.5–8.1)

## 2020-05-26 LAB — D-DIMER, QUANTITATIVE: D-Dimer, Quant: 0.91 ug/mL-FEU — ABNORMAL HIGH (ref 0.00–0.50)

## 2020-05-26 LAB — MAGNESIUM: Magnesium: 2.5 mg/dL — ABNORMAL HIGH (ref 1.7–2.4)

## 2020-05-26 LAB — C-REACTIVE PROTEIN: CRP: 1.2 mg/dL — ABNORMAL HIGH (ref <1.0)

## 2020-05-26 MED ORDER — FREE WATER
200.0000 mL | Freq: Four times a day (QID) | Status: DC
  Administered 2020-05-26 – 2020-05-28 (×9): 200 mL via ORAL

## 2020-05-26 NOTE — Plan of Care (Signed)
  Problem: Education: Goal: Knowledge of risk factors and measures for prevention of condition will improve Outcome: Progressing   Problem: Coping: Goal: Psychosocial and spiritual needs will be supported Outcome: Progressing   Problem: Respiratory: Goal: Will maintain a patent airway Outcome: Progressing Goal: Complications related to the disease process, condition or treatment will be avoided or minimized Outcome: Progressing   

## 2020-05-26 NOTE — Progress Notes (Addendum)
PROGRESS NOTE   Jeffrey Abbott  [id number]:[id number] DOB: Aug 17, 1960 DOA: 05/22/2020 PCP: Chevis Pretty, FNP  Brief Narrative:  60 year old white male chronic RBBB, chronic tobacco, cerumen impactions Seen at primary care practice by televisit 05/20/2020 with upper respiratory symptoms cough headache nausea sore throat-advised symptomatic management with hydration Tessalon Perles and given azithromycin prescription Took home Covid test apparently 05/11/2020 which was positive Found to be hypoxic and requiring 6 L nasal cannula  Has developed progressive worsening hypoxia and now on 12 L high flow nasal cannula up from 6 L at baseline when he came in  He does not smoke anymore-is relatively high functioning typically  He lives alone tells me his daughter is his next of kin -He has required high flow oxygen for the past several days and I expect he will be here through the end of the week recovery   Assessment & Plan:   Principal Problem:   COVID-19 virus infection Active Problems:   Acute respiratory failure with hypoxia ([place])   1. Acute hypoxic respiratory failure secondary to coronavirus a. given 1 dose of Actemra on day of admission b. Oxygen requirements remain 10 to 12 L-increased work of breathing on less and less c. Out of bed, prone, continue to wean oxygen as able-needs to be in hospital until requiring 6 L high flow or less d. Transition to oral steroids on 2/15 with slow long taper given underlying COPD 2. Diarrhea not otherwise specified a. Stool more solid-discontinue C. difficile, lactoferrin b. Stopped laxatives 2/13 3. Prior tobacco or suspected COPD a. Administer albuterol, Tussionex-steroids as above 4. Leukocytosis a. Likely secondary to Solu-Medrol as above b. No further work-up at this juncture as no further diarrhea 5. Mild AKI a. Increase IV saline 75-->125 given worsening labs 6. Mildly elevated LFTs from admission a. Probably secondary to viral  illness from Covid b. Monitor 7. Impaired glucose tolerance a. Secondary to steroids-only monitor-consider treatment if above 180 consistently 8. Chronic RBBB a. Stable at this time  DVT prophylaxis: Prophylactic Lovenox Code Status: Full code Family Communication: discussed on 2/14 with daughter [phone] [name] on 2/14 Disposition:  Status is: Inpatient  Remains inpatient appropriate because:IV treatments appropriate due to intensity of illness or inability to take PO   Dispo: The patient is from: Home              Anticipated d/c is to: Home              Anticipated d/c date is: > 3 days              Patient currently is not medically stable to d/c.   Difficult to place patient No       Consultants:   None yet  Procedures: No  Antimicrobials: None   Subjective:  Some confusion at times early in the mornings but otherwise seems fair-sitting up in bed proning well eating breakfast Eating drinking  no distress Becomes winded when I de-escalate him to less than 10 L of oxygen high flow No chest pain  Objective: Vitals:   05/25/20 1800 05/25/20 2119 05/26/20 0150 05/26/20 0525  BP: (!) 151/86 (!) 152/87 (!) 144/68 136/81  Pulse: 66 69 72 63  Resp: (!) 22 (!) 22 20 20   Temp: 97.7 F (36.5 C) 98 F (36.7 C) 97.7 F (36.5 C) 97.6 F (36.4 C)  TempSrc: Oral Oral Oral Oral  SpO2: 90% 93% 94% 90%  Weight:      Height:  Intake/Output Summary (Last 24 hours) at 05/26/2020 0846 Last data filed at 05/26/2020 0300 Gross per 24 hour  Intake 461.5 ml  Output 425 ml  Net 36.5 ml   Filed Weights   05/23/20 0821 05/23/20 1900  Weight: 102.1 kg 102.5 kg    Examination:  Awake coherent no distress EOMI NCAT no focal deficit S1-S2 no murmur Chest clear no added sound rales rhonchi No lower extremity edema ROM intact   Data Reviewed: I have personally reviewed following labs and imaging studies  BUNs/creatinine from admission up to 40/0.9 AST/ALT  59/102 up from baseline White count 14.6 hemoglobin 13.5 platelet 360   COVID-19 Labs  Recent Labs    05/24/20 0349 05/25/20 0316 05/26/20 0433  DDIMER 0.47 0.64* 0.91*  CRP 7.9* 2.9* 1.2*    Lab Results  Component Value Date   SARSCOV2NAA POSITIVE (A) 05/23/2020     Radiology Studies: No results found.   Scheduled Meds: . vitamin C  500 mg Oral Daily  . enoxaparin (LOVENOX) injection  40 mg Subcutaneous Q24H  . free water  200 mL Oral Q6H  . methylPREDNISolone (SOLU-MEDROL) injection  50 mg Intravenous Q6H  . zinc sulfate  220 mg Oral Daily  None Continuous Infusions: . sodium chloride 75 mL/hr at 05/25/20 2359     LOS: 3 days    Time spent: [place], MD [facility] To contact the attending provider between 7A-7P or the covering provider during after hours 7P-7A, please log into the web site [email or web address] and access using universal [place] password for that web site. If you do not have the password, please call the hospital operator.  05/26/2020, 8:46 AM

## 2020-05-27 ENCOUNTER — Inpatient Hospital Stay (HOSPITAL_COMMUNITY): Payer: 59

## 2020-05-27 DIAGNOSIS — R7989 Other specified abnormal findings of blood chemistry: Secondary | ICD-10-CM | POA: Diagnosis not present

## 2020-05-27 DIAGNOSIS — U071 COVID-19: Secondary | ICD-10-CM

## 2020-05-27 LAB — CBC WITH DIFFERENTIAL/PLATELET
Abs Immature Granulocytes: 0.52 10*3/uL — ABNORMAL HIGH (ref 0.00–0.07)
Basophils Absolute: 0 10*3/uL (ref 0.0–0.1)
Basophils Relative: 0 %
Eosinophils Absolute: 0 10*3/uL (ref 0.0–0.5)
Eosinophils Relative: 0 %
HCT: 38.6 % — ABNORMAL LOW (ref 39.0–52.0)
Hemoglobin: 12.8 g/dL — ABNORMAL LOW (ref 13.0–17.0)
Immature Granulocytes: 4 %
Lymphocytes Relative: 8 %
Lymphs Abs: 1.1 10*3/uL (ref 0.7–4.0)
MCH: 29.4 pg (ref 26.0–34.0)
MCHC: 33.2 g/dL (ref 30.0–36.0)
MCV: 88.5 fL (ref 80.0–100.0)
Monocytes Absolute: 0.8 10*3/uL (ref 0.1–1.0)
Monocytes Relative: 5 %
Neutro Abs: 12 10*3/uL — ABNORMAL HIGH (ref 1.7–7.7)
Neutrophils Relative %: 83 %
Platelets: 283 10*3/uL (ref 150–400)
RBC: 4.36 MIL/uL (ref 4.22–5.81)
RDW: 14.1 % (ref 11.5–15.5)
WBC: 14.5 10*3/uL — ABNORMAL HIGH (ref 4.0–10.5)
nRBC: 0.1 % (ref 0.0–0.2)

## 2020-05-27 LAB — COMPREHENSIVE METABOLIC PANEL
ALT: 161 U/L — ABNORMAL HIGH (ref 0–44)
AST: 77 U/L — ABNORMAL HIGH (ref 15–41)
Albumin: 2.7 g/dL — ABNORMAL LOW (ref 3.5–5.0)
Alkaline Phosphatase: 43 U/L (ref 38–126)
Anion gap: 9 (ref 5–15)
BUN: 37 mg/dL — ABNORMAL HIGH (ref 8–23)
CO2: 20 mmol/L — ABNORMAL LOW (ref 22–32)
Calcium: 7.7 mg/dL — ABNORMAL LOW (ref 8.9–10.3)
Chloride: 110 mmol/L (ref 98–111)
Creatinine, Ser: 1.08 mg/dL (ref 0.61–1.24)
GFR, Estimated: >60 mL/min (ref >60)
Glucose, Bld: 144 mg/dL — ABNORMAL HIGH (ref 70–99)
Potassium: 4.6 mmol/L (ref 3.5–5.1)
Sodium: 139 mmol/L (ref 135–145)
Total Bilirubin: 1.3 mg/dL — ABNORMAL HIGH (ref 0.3–1.2)
Total Protein: 5.4 g/dL — ABNORMAL LOW (ref 6.5–8.1)

## 2020-05-27 LAB — D-DIMER, QUANTITATIVE: D-Dimer, Quant: 2.18 ug/mL-FEU — ABNORMAL HIGH (ref 0.00–0.50)

## 2020-05-27 LAB — C-REACTIVE PROTEIN: CRP: 0.7 mg/dL (ref <1.0)

## 2020-05-27 MED ORDER — PREDNISONE 20 MG PO TABS
60.0000 mg | ORAL_TABLET | Freq: Every day | ORAL | Status: DC
  Administered 2020-05-27 – 2020-05-30 (×4): 60 mg via ORAL
  Filled 2020-05-27 (×4): qty 3

## 2020-05-27 NOTE — Plan of Care (Signed)
  Problem: Education: Goal: Knowledge of risk factors and measures for prevention of condition will improve Outcome: Progressing   Problem: Coping: Goal: Psychosocial and spiritual needs will be supported Outcome: Progressing   Problem: Respiratory: Goal: Will maintain a patent airway Outcome: Progressing Goal: Complications related to the disease process, condition or treatment will be avoided or minimized Outcome: Progressing   

## 2020-05-27 NOTE — Plan of Care (Signed)
  Problem: Education: Goal: Knowledge of risk factors and measures for prevention of condition will improve Outcome: Progressing   

## 2020-05-27 NOTE — Progress Notes (Signed)
PROGRESS NOTE   Jeffrey Abbott  KGM:[id number] DOB: 11-25-1960 DOA: 05/22/2020 PCP: Chevis Pretty, FNP  Brief Narrative:  60 year old white male chronic RBBB, chronic tobacco, cerumen impactions Seen at primary care practice by televisit 05/20/2020 with upper respiratory symptoms cough headache nausea sore throat-advised symptomatic management with hydration Tessalon Perles and given azithromycin prescription Took home Covid test apparently 05/11/2020 which was positive Found to be hypoxic and requiring 6 L nasal cannula  Has developed progressive worsening hypoxia and now on 12 L high flow nasal cannula up from 6 L at baseline when he came in  He does not smoke anymore-is relatively high functioning typically  He lives alone tells me his daughter is his next of kin -He has required high flow oxygen for the past several days and I expect he will be here through the end of the week recovery   Assessment & Plan:   Principal Problem:   COVID-19 virus infection Active Problems:   Acute respiratory failure with hypoxia ([place])   1. Acute hypoxic respiratory failure secondary to coronavirus a. given 1 dose of Actemra on day of admission b. Oxygen requirements remain 10 to 12 L-increased work of breathing on less and less c. Out of bed, prone, continue to wean oxygen as able-needs to be in hospital until requiring 6 L high flow or less d. Solu-Medrol-->prednisone 50 on 2/15 2. Elevated D-dimer a. Get nonemergent lower extremity duplex rule out DVT b. continue prophylactic Lovenox at this time 3. Diarrhea not otherwise specified a. Stool more solid-discontinue C. difficile, lactoferrin b. Stopped laxatives 2/13 4. Prior tobacco or suspected COPD a. Administer albuterol, Tussionex-steroids as above 5. Leukocytosis a. Likely secondary to Solu-Medrol as above b. No further work-up at this juncture as no further diarrhea 6. Mild AKI a. Increase IV saline 75-->125 given worsening  labs b. Have encouraged nursing to force fluids labs-Daily labs no changes 7. Mildly elevated LFTs from admission a. Probably secondary to viral illness from Covid b. Monitor only at this juncture no symptoms of abdominal pathology c. If continues to climb may need to look at other sources 8. Impaired glucose tolerance a. Secondary to steroids-only monitor-consider treatment if above 180 consistently 9. Chronic RBBB a. Stable at this time  DVT prophylaxis: Prophylactic Lovenox Code Status: Full code Family Communication: discussed on 2/14 with daughter [phone] [name]  Disposition:  Status is: Inpatient  Remains inpatient appropriate because:IV treatments appropriate due to intensity of illness or inability to take PO   Dispo: The patient is from: Home              Anticipated d/c is to: Home              Anticipated d/c date is: > 3 days              Patient currently is not medically stable to d/c.   Difficult to place patient No   Consultants:   None yet  Procedures: No  Antimicrobials: None   Subjective:  overall doing well no distress Eating drinking no chest pain no fever no abdominal pain Have encouraged him to drink a lot of water Filled out his FMLA paperwork  Objective: Vitals:   05/26/20 2014 05/26/20 2014 05/26/20 2300 05/27/20 0404  BP:  (!) 164/98 (!) 149/82 140/89  Pulse:  63 60 60  Resp:  20  20  Temp:  (!) 97.5 F (36.4 C)  97.7 F (36.5 C)  TempSrc:  Oral  SpO2: (!) 88% (!) 88%  92%  Weight:      Height:        Intake/Output Summary (Last 24 hours) at 05/27/2020 1151 Last data filed at 05/27/2020 1015 Gross per 24 hour  Intake 1536 ml  Output 2600 ml  Net -1064 ml   Filed Weights   05/23/20 0821 05/23/20 1900  Weight: 102.1 kg 102.5 kg    Examination:  Awake coherent no distress EOMI NCAT no focal deficit-on 8 L looking comfortable S1-S2 no murmur Chest clear no added sound rales rhonchi No lower extremity edema ROM  intact   Data Reviewed: I have personally reviewed following labs and imaging studies  BUNs/creatinine currently 37/1.0 AST/ALT 59/102-->77/161 White count 14.5 hemoglobin 12.8 platelet 283   COVID-19 Labs  Recent Labs    05/25/20 0316 05/26/20 0433 05/27/20 0435  DDIMER 0.64* 0.91* 2.18*  CRP 2.9* 1.2* 0.7    Lab Results  Component Value Date   SARSCOV2NAA POSITIVE (A) 05/23/2020     Radiology Studies: VAS [place] LOWER EXTREMITY VENOUS (DVT)  Result Date: 05/27/2020  Lower Venous DVT Study Indications: Elevated Ddimer.  Risk Factors: COVID 19 positive. Comparison Study: No prior studies. Performing Technologist: Oliver Hum RVT  Examination Guidelines: A complete evaluation includes B-mode imaging, spectral Doppler, color Doppler, and power Doppler as needed of all accessible portions of each vessel. Bilateral testing is considered an integral part of a complete examination. Limited examinations for reoccurring indications may be performed as noted. The reflux portion of the exam is performed with the patient in reverse Trendelenburg.  +---------+---------------+---------+-----------+----------+--------------+  RIGHT     Compressibility Phasicity Spontaneity Properties Thrombus Aging  +---------+---------------+---------+-----------+----------+--------------+  CFV       Full            Yes       Yes                                    +---------+---------------+---------+-----------+----------+--------------+  SFJ       Full                                                             +---------+---------------+---------+-----------+----------+--------------+  FV Prox   Full                                                             +---------+---------------+---------+-----------+----------+--------------+  FV Mid    Full                                                             +---------+---------------+---------+-----------+----------+--------------+  FV Distal Full                                                              +---------+---------------+---------+-----------+----------+--------------+  PFV       Full                                                             +---------+---------------+---------+-----------+----------+--------------+  POP       Full            Yes       Yes                                    +---------+---------------+---------+-----------+----------+--------------+  PTV       Full                                                             +---------+---------------+---------+-----------+----------+--------------+  PERO      Full                                                             +---------+---------------+---------+-----------+----------+--------------+   +---------+---------------+---------+-----------+----------+--------------+  LEFT      Compressibility Phasicity Spontaneity Properties Thrombus Aging  +---------+---------------+---------+-----------+----------+--------------+  CFV       Full            Yes       Yes                                    +---------+---------------+---------+-----------+----------+--------------+  SFJ       Full                                                             +---------+---------------+---------+-----------+----------+--------------+  FV Prox   Full                                                             +---------+---------------+---------+-----------+----------+--------------+  FV Mid    Full                                                             +---------+---------------+---------+-----------+----------+--------------+  FV Distal Full                                                             +---------+---------------+---------+-----------+----------+--------------+  PFV       Full                                                             +---------+---------------+---------+-----------+----------+--------------+  POP       Full            Yes       Yes                                     +---------+---------------+---------+-----------+----------+--------------+  PTV       Full                                                             +---------+---------------+---------+-----------+----------+--------------+  PERO      Full                                                             +---------+---------------+---------+-----------+----------+--------------+     Summary: RIGHT: - There is no evidence of deep vein thrombosis in the lower extremity.  - No cystic structure found in the popliteal fossa.  LEFT: - There is no evidence of deep vein thrombosis in the lower extremity.  - No cystic structure found in the popliteal fossa.  *See table(s) above for measurements and observations.    Preliminary      Scheduled Meds:  vitamin C  500 mg Oral Daily   enoxaparin (LOVENOX) injection  40 mg Subcutaneous Q24H   free water  200 mL Oral Q6H   zinc sulfate  220 mg Oral Daily  None Continuous Infusions:  sodium chloride 125 mL/hr at 05/26/20 1040     LOS: 4 days    Time spent: [place], MD [facility] To contact the attending provider between 7A-7P or the covering provider during after hours 7P-7A, please log into the web site [email or web address] and access using universal [place] password for that web site. If you do not have the password, please call the hospital operator.  05/27/2020, 11:51 AM

## 2020-05-27 NOTE — Progress Notes (Signed)
Bilateral lower extremity venous duplex has been completed. Preliminary results can be found in CV Proc through chart review.   05/27/20 11:38 AM Carlos Levering RVT

## 2020-05-28 ENCOUNTER — Inpatient Hospital Stay (HOSPITAL_COMMUNITY): Payer: 59

## 2020-05-28 DIAGNOSIS — R739 Hyperglycemia, unspecified: Secondary | ICD-10-CM

## 2020-05-28 DIAGNOSIS — R7303 Prediabetes: Secondary | ICD-10-CM | POA: Insufficient documentation

## 2020-05-28 DIAGNOSIS — J1282 Pneumonia due to coronavirus disease 2019: Secondary | ICD-10-CM

## 2020-05-28 DIAGNOSIS — R197 Diarrhea, unspecified: Secondary | ICD-10-CM

## 2020-05-28 DIAGNOSIS — R7989 Other specified abnormal findings of blood chemistry: Secondary | ICD-10-CM

## 2020-05-28 DIAGNOSIS — Z87891 Personal history of nicotine dependence: Secondary | ICD-10-CM

## 2020-05-28 DIAGNOSIS — N179 Acute kidney failure, unspecified: Secondary | ICD-10-CM

## 2020-05-28 LAB — CULTURE, BLOOD (ROUTINE X 2)
Culture: NO GROWTH
Culture: NO GROWTH
Special Requests: ADEQUATE
Special Requests: ADEQUATE

## 2020-05-28 LAB — GLUCOSE, CAPILLARY
Glucose-Capillary: 129 mg/dL — ABNORMAL HIGH (ref 70–99)
Glucose-Capillary: 105 mg/dL — ABNORMAL HIGH (ref 70–99)

## 2020-05-28 LAB — C-REACTIVE PROTEIN: CRP: 0.6 mg/dL (ref <1.0)

## 2020-05-28 LAB — COMPREHENSIVE METABOLIC PANEL
ALT: 197 U/L — ABNORMAL HIGH (ref 0–44)
AST: 67 U/L — ABNORMAL HIGH (ref 15–41)
Albumin: 2.6 g/dL — ABNORMAL LOW (ref 3.5–5.0)
Alkaline Phosphatase: 49 U/L (ref 38–126)
Anion gap: 8 (ref 5–15)
BUN: 35 mg/dL — ABNORMAL HIGH (ref 8–23)
CO2: 21 mmol/L — ABNORMAL LOW (ref 22–32)
Calcium: 7.8 mg/dL — ABNORMAL LOW (ref 8.9–10.3)
Chloride: 112 mmol/L — ABNORMAL HIGH (ref 98–111)
Creatinine, Ser: 0.99 mg/dL (ref 0.61–1.24)
GFR, Estimated: >60 mL/min (ref >60)
Glucose, Bld: 114 mg/dL — ABNORMAL HIGH (ref 70–99)
Potassium: 4.7 mmol/L (ref 3.5–5.1)
Sodium: 141 mmol/L (ref 135–145)
Total Bilirubin: 1.1 mg/dL (ref 0.3–1.2)
Total Protein: 5.3 g/dL — ABNORMAL LOW (ref 6.5–8.1)

## 2020-05-28 LAB — D-DIMER, QUANTITATIVE: D-Dimer, Quant: 2.9 ug/mL-FEU — ABNORMAL HIGH (ref 0.00–0.50)

## 2020-05-28 LAB — PROCALCITONIN: Procalcitonin: <0.1 ng/mL

## 2020-05-28 MED ORDER — IOHEXOL 350 MG/ML SOLN
100.0000 mL | Freq: Once | INTRAVENOUS | Status: AC | PRN
  Administered 2020-05-28: 100 mL via INTRAVENOUS

## 2020-05-28 MED ORDER — HEPARIN (PORCINE) 25000 UT/250ML-% IV SOLN
1500.0000 [IU]/h | INTRAVENOUS | Status: DC
  Administered 2020-05-28: 1500 [IU]/h via INTRAVENOUS
  Filled 2020-05-28 (×3): qty 250

## 2020-05-28 MED ORDER — HEPARIN BOLUS VIA INFUSION
2500.0000 [IU] | Freq: Once | INTRAVENOUS | Status: AC
  Administered 2020-05-28: 2500 [IU] via INTRAVENOUS
  Filled 2020-05-28: qty 2500

## 2020-05-28 MED ORDER — INSULIN ASPART 100 UNIT/ML ~~LOC~~ SOLN: 0.0000 [IU] | Freq: Every day | SUBCUTANEOUS | Status: DC

## 2020-05-28 MED ORDER — SODIUM CHLORIDE 0.9 % IV SOLN: INTRAVENOUS | Status: AC

## 2020-05-28 MED ORDER — INSULIN ASPART 100 UNIT/ML ~~LOC~~ SOLN
0.0000 [IU] | Freq: Three times a day (TID) | SUBCUTANEOUS | Status: DC
  Administered 2020-05-28 – 2020-05-29 (×2): 2 [IU] via SUBCUTANEOUS

## 2020-05-28 NOTE — Assessment & Plan Note (Addendum)
-   patient states positive home test on 05/11/20; positive on admission on 2/11 as well - s/p tocilizumab on 05/23/20 - continue steroids.  Discharged on course to finish - continue O2.  On 4 L - negative LE duplex on 05/27/20. Positive for B/L PE on 05/28/20 -Procalcitonin negative x3

## 2020-05-28 NOTE — Assessment & Plan Note (Addendum)
-  Considered due to acute illness in setting of Covid -Continuing to downtrend -Repeat CMP at follow-up

## 2020-05-28 NOTE — Plan of Care (Signed)
  Problem: Education: Goal: Knowledge of risk factors and measures for prevention of condition will improve Outcome: Progressing   Problem: Coping: Goal: Psychosocial and spiritual needs will be supported Outcome: Progressing   Problem: Respiratory: Goal: Will maintain a patent airway Outcome: Progressing Goal: Complications related to the disease process, condition or treatment will be avoided or minimized Outcome: Progressing   

## 2020-05-28 NOTE — Assessment & Plan Note (Addendum)
-  Considered due to COVID-19 pneumonia and now B/L PE diagnosed on 05/28/20 -Continue weaning oxygen as able; discharging home on 4 L oxygen -Continue incentive spirometer

## 2020-05-28 NOTE — Assessment & Plan Note (Signed)
-  Improved.  Laxatives were discontinued

## 2020-05-28 NOTE — Assessment & Plan Note (Addendum)
-   baseline creatinine u/k but patient states no known prior history of renal dysfunction - patient presents with creat 1.2 and elevated BUN; started on IVF and creat has improved as well as BUN -Creatinine normalized prior to discharge

## 2020-05-28 NOTE — Progress Notes (Signed)
PROGRESS NOTE    Jeffrey Abbott   XHB:[id number]  DOB: [birth year]  DOA: 05/22/2020     5  PCP: Jeffrey Pretty, FNP  CC: SOB  Hospital Course: Mr. Jeffrey Abbott is a 60 yo male with PMH HLD, prior tobacco use, chronic RBBB who presented to the hospital due to worsening shortness of breath at home.  He was evaluated outpatient by telemetry medicine on 05/20/2020; he had reported that he tested positive on a home Covid test on 05/11/2020. He was hypoxic on admission and O2 requirements escalated quickly from 6 L [place] to 12 L [place] HFNC.  He was treated with tocilizumab on 05/23/20 and started on steroids.     Interval History:  No events overnight.  Resting in bed comfortable this morning but remains on increased amount of oxygen.  Still having ongoing intermittent coughing and desats easily without oxygen.  Old records reviewed in assessment of this patient  ROS: Constitutional: positive for fatigue and malaise, Respiratory: positive for cough and dyspnea on exertion, Cardiovascular: negative for chest pain and Gastrointestinal: negative for abdominal pain  Assessment & Plan: * Pneumonia due to COVID-19 virus - patient states positive home test on 05/11/20; positive on admission on 2/11 as well - s/p tocilizumab on 05/23/20 - continue steroids - continue O2 - continue trending inflammatory markers - dimer uptrending still and given ongoing O2 requirement, will obtain CTA chest at this time to evaluate for PE and further lung parenchyma; lower extremity duplex negative on 05/27/2020 - also trend PCT given prolonged symptoms and risk for developing superimposed bacterial infection   Acute respiratory failure with hypoxia ([place]) -Considered due to COVID-19 pneumonia -Other considered differential at this time now includes possible PE.  Obtain CTA chest to further evaluate -Continue weaning oxygen as able -Continue incentive spirometer  AKI (acute kidney injury) ([place]) - baseline  creatinine u/k but patient states no known prior history of renal dysfunction - patient presents with creat 1.2; started on IVF and creat has improved - continue diet and encouraging good oral intake  Elevated LFTs -Considered due to acute illness in setting of Covid -Continue trending LFTs; further work-up if further elevation  Hyperglycemia -Suspected from steroid use -Continue SSI and CBG monitoring -Check A1c  History of tobacco use -Continue breathing treatments as needed -Patient no longer endorses tobacco use  Abnormal EKG - chronic RBBB - stable; continue on tele   Diarrhea-resolved as of 05/28/2020 -Improved.  Laxatives were discontinued    Antimicrobials: Tocilizumab 05/23/2020  DVT prophylaxis: enoxaparin (LOVENOX) injection 40 mg Start: 05/23/20 1000   Code Status:   Code Status: Full Code Family Communication: none  Disposition Plan: Status is: Inpatient  Remains inpatient appropriate because:IV treatments appropriate due to intensity of illness or inability to take PO and Inpatient level of care appropriate due to severity of illness   Dispo: The patient is from: Home              Anticipated d/c is to: Home              Anticipated d/c date is: > 3 days              Patient currently is not medically stable to d/c.   Difficult to place patient No  Risk of unplanned readmission score: Unplanned Admission- Pilot do not use: 13.66   Objective: Blood pressure (!) 143/83, pulse 68, temperature 98 F (36.7 C), temperature source Oral, resp. rate 14, height 5\' 7"  (1.702 m), weight  102.5 kg, SpO2 94 %.  Examination: General appearance: alert, cooperative and no distress Head: Normocephalic, without obvious abnormality, atraumatic Eyes: EOMI Lungs: Distant but relatively clear breath sounds, no obvious wheezing or rhonchi Heart: regular rate and rhythm and S1, S2 normal Abdomen: normal findings: bowel sounds normal and soft, non-tender Extremities:  Bilateral lower extremity edema noted Skin: mobility and turgor normal Neurologic: Grossly normal  Consultants:     Procedures:     Data Reviewed: I have personally reviewed following labs and imaging studies Results for orders placed or performed during the hospital encounter of 05/22/20 (from the past 24 hour(s))  Comprehensive metabolic panel     Status: Abnormal   Collection Time: 05/28/20  3:57 AM  Result Value Ref Range   Sodium 141 135 - 145 mmol/L   Potassium 4.7 3.5 - 5.1 mmol/L   Chloride 112 (H) 98 - 111 mmol/L   CO2 21 (L) 22 - 32 mmol/L   Glucose, Bld 114 (H) 70 - 99 mg/dL   BUN 35 (H) 8 - 23 mg/dL   Creatinine, Ser 0.99 0.61 - 1.24 mg/dL   Calcium 7.8 (L) 8.9 - 10.3 mg/dL   Total Protein 5.3 (L) 6.5 - 8.1 g/dL   Albumin 2.6 (L) 3.5 - 5.0 g/dL   AST 67 (H) 15 - 41 U/L   ALT 197 (H) 0 - 44 U/L   Alkaline Phosphatase 49 38 - 126 U/L   Total Bilirubin 1.1 0.3 - 1.2 mg/dL   GFR, Estimated >60 >60 mL/min   Anion gap 8 5 - 15  C-reactive protein     Status: None   Collection Time: 05/28/20  3:57 AM  Result Value Ref Range   CRP 0.6 <1.0 mg/dL  D-dimer, quantitative (not at [facility])     Status: Abnormal   Collection Time: 05/28/20  3:57 AM  Result Value Ref Range   D-Dimer, Quant 2.90 (H) 0.00 - 0.50 ug/mL-FEU    Recent Results (from the past 240 hour(s))  Blood Culture (routine x 2)     Status: None   Collection Time: 05/22/20 11:30 PM   Specimen: BLOOD  Result Value Ref Range Status   Specimen Description   Final    BLOOD RIGHT ANTECUBITAL Performed at [facility], [address], [place], [place] [address]    Special Requests   Final    BOTTLES DRAWN AEROBIC AND ANAEROBIC Blood Culture adequate volume Performed at [facility] [address], [place], [place] [address]    Culture   Final    NO GROWTH 5 DAYS Performed at [facility], [place] [address], [place], [place] [address]    Report Status 05/28/2020  FINAL  Final  Blood Culture (routine x 2)     Status: None   Collection Time: 05/22/20 11:40 PM   Specimen: BLOOD  Result Value Ref Range Status   Specimen Description   Final    BLOOD BLOOD RIGHT HAND Performed at [facility] [address], [place], [place] [address]    Special Requests   Final    BOTTLES DRAWN AEROBIC AND ANAEROBIC Blood Culture adequate volume Performed at [facility] [address], [place], [place] [address]    Culture   Final    NO GROWTH 5 DAYS Performed at [facility], [place] [address], [place],  [address]    Report Status 05/28/2020 FINAL  Final  Resp Panel by RT-PCR (Flu A&B,  Covid)     Status: Abnormal   Collection Time: 05/23/20  1:00 AM  Result Value Ref Range Status   SARS Coronavirus 2 by RT PCR POSITIVE (A) NEGATIVE Final    Comment: CRITICAL RESULT CALLED TO, READ BACK BY AND VERIFIED WITH: SCHISSELBEIN S. @ 0303 BY MECIAL J. (NOTE) SARS-CoV-2 target nucleic acids are DETECTED.  The SARS-CoV-2 RNA is generally detectable in upper respiratory specimens during the acute phase of infection. Positive results are indicative of the presence of the identified virus, but do not rule out bacterial infection or co-infection with other pathogens not detected by the test. Clinical correlation with patient history and other diagnostic information is necessary to determine patient infection status. The expected result is Negative.  Fact Sheet for Patients: EntrepreneurPulse.com.au  Fact Sheet for Healthcare Providers: IncredibleEmployment.be  This test is not yet approved or cleared by the [place] FDA and  has been authorized for detection and/or diagnosis of SARS-CoV-2 by FDA under an Emergency Use Authorization (EUA).  This EUA will remain in effect (meaning this  test can be used) for the duration of  the COVID-19 declaration under Section 564(b)(1) of  the Act, 21 U.S.C. section 360bbb-3(b)(1), unless the authorization is terminated or revoked sooner.     Influenza A by PCR NEGATIVE NEGATIVE Final   Influenza B by PCR NEGATIVE NEGATIVE Final    Comment: (NOTE) The Xpert Xpress SARS-CoV-2/FLU/RSV plus assay is intended as an aid in the diagnosis of influenza from Nasopharyngeal swab specimens and should not be used as a sole basis for treatment. Nasal washings and aspirates are unacceptable for Xpert Xpress SARS-CoV-2/FLU/RSV testing.  Fact Sheet for Patients: EntrepreneurPulse.com.au  Fact Sheet for Healthcare Providers: IncredibleEmployment.be  This test is not yet approved or cleared by the [place] FDA and has been authorized for detection and/or diagnosis of SARS-CoV-2 by FDA under an Emergency Use Authorization (EUA). This EUA will remain in effect (meaning this test can be used) for the duration of the COVID-19 declaration under Section 564(b)(1) of the Act, 21 U.S.C. section 360bbb-3(b)(1), unless the authorization is terminated or revoked.  Performed at [facility], [place] [address], [place], [place] [address]      Radiology Studies: VAS [place] LOWER EXTREMITY VENOUS (DVT)  Result Date: 05/27/2020  Lower Venous DVT Study Indications: Elevated Ddimer.  Risk Factors: COVID 19 positive. Comparison Study: No prior studies. Performing Technologist: Oliver Hum RVT  Examination Guidelines: A complete evaluation includes B-mode imaging, spectral Doppler, color Doppler, and power Doppler as needed of all accessible portions of each vessel. Bilateral testing is considered an integral part of a complete examination. Limited examinations for reoccurring indications may be performed as noted. The reflux portion of the exam is performed with the patient in reverse Trendelenburg.  +---------+---------------+---------+-----------+----------+--------------+ RIGHT     CompressibilityPhasicitySpontaneityPropertiesThrombus Aging +---------+---------------+---------+-----------+----------+--------------+ CFV      Full           Yes      Yes                                 +---------+---------------+---------+-----------+----------+--------------+ SFJ      Full                                                        +---------+---------------+---------+-----------+----------+--------------+  FV Prox  Full                                                        +---------+---------------+---------+-----------+----------+--------------+ FV Mid   Full                                                        +---------+---------------+---------+-----------+----------+--------------+ FV DistalFull                                                        +---------+---------------+---------+-----------+----------+--------------+ PFV      Full                                                        +---------+---------------+---------+-----------+----------+--------------+ POP      Full           Yes      Yes                                 +---------+---------------+---------+-----------+----------+--------------+ PTV      Full                                                        +---------+---------------+---------+-----------+----------+--------------+ PERO     Full                                                        +---------+---------------+---------+-----------+----------+--------------+   +---------+---------------+---------+-----------+----------+--------------+ LEFT     CompressibilityPhasicitySpontaneityPropertiesThrombus Aging +---------+---------------+---------+-----------+----------+--------------+ CFV      Full           Yes      Yes                                 +---------+---------------+---------+-----------+----------+--------------+ SFJ      Full                                                         +---------+---------------+---------+-----------+----------+--------------+ FV Prox  Full                                                        +---------+---------------+---------+-----------+----------+--------------+  FV Mid   Full                                                        +---------+---------------+---------+-----------+----------+--------------+ FV DistalFull                                                        +---------+---------------+---------+-----------+----------+--------------+ PFV      Full                                                        +---------+---------------+---------+-----------+----------+--------------+ POP      Full           Yes      Yes                                 +---------+---------------+---------+-----------+----------+--------------+ PTV      Full                                                        +---------+---------------+---------+-----------+----------+--------------+ PERO     Full                                                        +---------+---------------+---------+-----------+----------+--------------+     Summary: RIGHT: - There is no evidence of deep vein thrombosis in the lower extremity.  - No cystic structure found in the popliteal fossa.  LEFT: - There is no evidence of deep vein thrombosis in the lower extremity.  - No cystic structure found in the popliteal fossa.  *See table(s) above for measurements and observations. Electronically signed by Curt Jews MD on 05/27/2020 at 12:58:19 PM.    Final    VAS [place] LOWER EXTREMITY VENOUS (DVT)  Final Result    DG Chest [facility] 1 View  Final Result    CT ANGIO CHEST PE W OR WO CONTRAST    (Results Pending)    Scheduled Meds: . vitamin C  500 mg Oral Daily  . enoxaparin (LOVENOX) injection  40 mg Subcutaneous Q24H  . free water  200 mL Oral Q6H  . insulin aspart  0-15 Units Subcutaneous TID WC  . insulin aspart  0-5  Units Subcutaneous QHS  . predniSONE  60 mg Oral QAC breakfast  . zinc sulfate  220 mg Oral Daily   PRN Meds: acetaminophen, albuterol, chlorpheniramine-HYDROcodone, lip balm, ondansetron **OR** ondansetron (ZOFRAN) IV Continuous Infusions:   LOS: 5 days  Time spent: Greater than 50% of the 35 minute visit was spent in counseling/coordination of care for the patient as laid out in the A&P.   Shanon Brow  Aleeza [place], MD [facility] 05/28/2020, 3:58 PM

## 2020-05-28 NOTE — Progress Notes (Signed)
ANTICOAGULATION CONSULT NOTE - Initial Consult  Pharmacy Consult for Heparin Indication: pulmonary embolus  Allergies  Allergen Reactions  . Niaspan [Niacin Er]     Joint pain    Patient Measurements: Height: 5\' 7"  (170.2 cm) Weight: 102.5 kg (225 lb 15.5 oz) IBW/kg (Calculated) : 66.1 Heparin Dosing Weight: 88.6 kg  Vital Signs: Temp: 98 F (36.7 C) (02/16 1350) Temp Source: Oral (02/16 1350) BP: 143/83 (02/16 1350) Pulse Rate: 68 (02/16 1350)  Labs: Recent Labs    05/26/20 0433 05/27/20 0435 05/28/20 0357  HGB 13.5 12.8*  --   HCT 41.3 38.6*  --   PLT 360 283  --   CREATININE 0.95 1.08 0.99    Estimated Creatinine Clearance: 89.4 mL/min (by C-G formula based on SCr of 0.99 mg/dL).   Medical History: Past Medical History:  Diagnosis Date  . Allergy   . Hyperlipidemia    no meds  . 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 Umbilical hernia     Medications:  Scheduled:  . vitamin C  500 mg Oral Daily  . heparin  2,500 Units Intravenous Once  . insulin aspart  0-15 Units Subcutaneous TID WC  . insulin aspart  0-5 Units Subcutaneous QHS  . predniSONE  60 mg Oral QAC breakfast  . zinc sulfate  220 mg Oral Daily   Infusions:  . sodium chloride 75 mL/hr at 05/28/20 1728  . heparin     PRN: acetaminophen, albuterol, chlorpheniramine-HYDROcodone, lip balm, ondansetron **OR** ondansetron (ZOFRAN) IV  Assessment: 60 yo male with COVID-19 pneumonia now with acute subsegmental bilateral lower lobe PE.  Pharmacy consulted to dose IV heparin.  Patient has been on lovenox 40mg  SQ q24h, most recent dose today at 10:49  Goal of Therapy:  Heparin level 0.3-0.7 units/ml Monitor platelets by anticoagulation protocol: Yes   Plan:  Heparin 2500 units IV bolus (reduced dose since recent lovenox) Heparin 1500 units/hr IV infusion Check heparin level 6hrs after starting Daily heparin level and CBC  Peggyann Juba,  PharmD, BCPS Pharmacy: [phone] 05/28/2020,7:08 PM

## 2020-05-28 NOTE — Assessment & Plan Note (Signed)
-   chronic RBBB - stable; continue on tele

## 2020-05-28 NOTE — Assessment & Plan Note (Addendum)
-  Hyperglycemia suspected from steroid use -Continue SSI and CBG monitoring -Check A1c = 5.8%

## 2020-05-28 NOTE — Hospital Course (Addendum)
Mr. Toner is a 60 yo male with PMH HLD, prior tobacco use, chronic RBBB who presented to the hospital due to worsening shortness of breath at home.  He was evaluated outpatient by telemetry medicine on 05/20/2020; he had reported that he tested positive on a home Covid test on 05/11/2020. He was hypoxic on admission and O2 requirements escalated quickly from 6 L  to 12 L [place] HFNC.  He was treated with tocilizumab on 05/23/20 and started on steroids.   Due to ongoing oxygen demand and uptrending D-dimer, he underwent CTA chest on 05/28/2020.  This revealed bilateral PE and he was started on a heparin drip. His Hgb remained stable and he was transitioned to Eliquis on 2/18. He will follow up with his PCP at discharge and was informed he will remain on treatment for at least 3 months.   He was able to be further weaned on oxygen and was weaned down to 4 L nasal cannula prior to discharge.  Home O2 was also arranged. Prednisone course was continued at discharge to finish completion. He was stable and amenable for discharging home as well.

## 2020-05-28 NOTE — Assessment & Plan Note (Signed)
-  Continue breathing treatments as needed -Patient no longer endorses tobacco use

## 2020-05-29 ENCOUNTER — Telehealth: Payer: Self-pay

## 2020-05-29 DIAGNOSIS — I2699 Other pulmonary embolism without acute cor pulmonale: Secondary | ICD-10-CM

## 2020-05-29 LAB — CBC WITH DIFFERENTIAL/PLATELET
Abs Immature Granulocytes: 0.62 10*3/uL — ABNORMAL HIGH (ref 0.00–0.07)
Basophils Absolute: 0 10*3/uL (ref 0.0–0.1)
Basophils Relative: 0 %
Eosinophils Absolute: 0.2 10*3/uL (ref 0.0–0.5)
Eosinophils Relative: 2 %
HCT: 40.7 % (ref 39.0–52.0)
Hemoglobin: 13.7 g/dL (ref 13.0–17.0)
Immature Granulocytes: 5 %
Lymphocytes Relative: 14 %
Lymphs Abs: 1.9 10*3/uL (ref 0.7–4.0)
MCH: 29.7 pg (ref 26.0–34.0)
MCHC: 33.7 g/dL (ref 30.0–36.0)
MCV: 88.3 fL (ref 80.0–100.0)
Monocytes Absolute: 0.8 10*3/uL (ref 0.1–1.0)
Monocytes Relative: 6 %
Neutro Abs: 10.2 10*3/uL — ABNORMAL HIGH (ref 1.7–7.7)
Neutrophils Relative %: 73 %
Platelets: 281 10*3/uL (ref 150–400)
RBC: 4.61 MIL/uL (ref 4.22–5.81)
RDW: 14 % (ref 11.5–15.5)
WBC: 13.8 10*3/uL — ABNORMAL HIGH (ref 4.0–10.5)
nRBC: 0 % (ref 0.0–0.2)

## 2020-05-29 LAB — COMPREHENSIVE METABOLIC PANEL
ALT: 166 U/L — ABNORMAL HIGH (ref 0–44)
AST: 43 U/L — ABNORMAL HIGH (ref 15–41)
Albumin: 2.7 g/dL — ABNORMAL LOW (ref 3.5–5.0)
Alkaline Phosphatase: 47 U/L (ref 38–126)
Anion gap: 8 (ref 5–15)
BUN: 28 mg/dL — ABNORMAL HIGH (ref 8–23)
CO2: 21 mmol/L — ABNORMAL LOW (ref 22–32)
Calcium: 7.8 mg/dL — ABNORMAL LOW (ref 8.9–10.3)
Chloride: 109 mmol/L (ref 98–111)
Creatinine, Ser: 0.9 mg/dL (ref 0.61–1.24)
GFR, Estimated: >60 mL/min (ref >60)
Glucose, Bld: 87 mg/dL (ref 70–99)
Potassium: 4.5 mmol/L (ref 3.5–5.1)
Sodium: 138 mmol/L (ref 135–145)
Total Bilirubin: 1.2 mg/dL (ref 0.3–1.2)
Total Protein: 5.2 g/dL — ABNORMAL LOW (ref 6.5–8.1)

## 2020-05-29 LAB — GLUCOSE, CAPILLARY
Glucose-Capillary: 78 mg/dL (ref 70–99)
Glucose-Capillary: 116 mg/dL — ABNORMAL HIGH (ref 70–99)
Glucose-Capillary: 128 mg/dL — ABNORMAL HIGH (ref 70–99)
Glucose-Capillary: 121 mg/dL — ABNORMAL HIGH (ref 70–99)

## 2020-05-29 LAB — LACTATE DEHYDROGENASE: LDH: 466 U/L — ABNORMAL HIGH (ref 98–192)

## 2020-05-29 LAB — D-DIMER, QUANTITATIVE: D-Dimer, Quant: 3.56 ug/mL-FEU — ABNORMAL HIGH (ref 0.00–0.50)

## 2020-05-29 LAB — MAGNESIUM: Magnesium: 2.2 mg/dL (ref 1.7–2.4)

## 2020-05-29 LAB — C-REACTIVE PROTEIN: CRP: 0.5 mg/dL (ref <1.0)

## 2020-05-29 LAB — PROCALCITONIN: Procalcitonin: <0.1 ng/mL

## 2020-05-29 LAB — HEMOGLOBIN A1C
Hgb A1c MFr Bld: 5.8 % — ABNORMAL HIGH (ref 4.8–5.6)
Mean Plasma Glucose: 119.76 mg/dL

## 2020-05-29 LAB — HEPARIN LEVEL (UNFRACTIONATED)
Heparin Unfractionated: 0.6 IU/mL (ref 0.30–0.70)
Heparin Unfractionated: 0.7 IU/mL (ref 0.30–0.70)

## 2020-05-29 MED ORDER — SODIUM CHLORIDE 0.9 % IV SOLN: INTRAVENOUS | Status: AC

## 2020-05-29 NOTE — Progress Notes (Signed)
PROGRESS NOTE    KHAREE LESESNE   JSH:[id number]  DOB: [birth year]  DOA: 05/22/2020     6  PCP: Chevis Pretty, FNP  CC: SOB  Hospital Course: Mr. Yearby is a 60 yo male with PMH HLD, prior tobacco use, chronic RBBB who presented to the hospital due to worsening shortness of breath at home.  He was evaluated outpatient by telemetry medicine on 05/20/2020; he had reported that he tested positive on a home Covid test on 05/11/2020. He was hypoxic on admission and O2 requirements escalated quickly from 6 L [place] to 12 L [place] HFNC.  He was treated with tocilizumab on 05/23/20 and started on steroids.   Due to ongoing oxygen demand and uptrending D-dimer, he underwent CTA chest on 05/28/2020.  This revealed bilateral PE and he was started on a heparin drip.   Interval History:  No events overnight.  Resting in bed comfortable this morning but remains on increased amount of oxygen.  Still having ongoing intermittent coughing and desats easily without oxygen.  Old records reviewed in assessment of this patient  ROS: Constitutional: positive for fatigue and malaise, Respiratory: positive for cough and dyspnea on exertion, Cardiovascular: negative for chest pain and Gastrointestinal: negative for abdominal pain  Assessment & Plan: * Pneumonia due to COVID-19 virus - patient states positive home test on 05/11/20; positive on admission on 2/11 as well - s/p tocilizumab on 05/23/20 - continue steroids - continue O2 - continue trending inflammatory markers - negative LE duplex on 05/27/20. Positive for B/L PE on 05/28/20 - also trend PCT given prolonged symptoms and risk for developing superimposed bacterial infection: negative x 3  Pulmonary embolism (HCC) -Diagnosed on CTA chest on 05/28/2020 -Started on heparin drip -Obtain echo -If hemoglobin remains stable with no decline/worsening of oxygen demand, will transition to [place] likely on 05/30/2020  Acute respiratory failure with hypoxia  ([place]) -Considered due to COVID-19 pneumonia and now B/L PE diagnosed on 05/28/20 -Continue weaning oxygen as able -Continue incentive spirometer  AKI (acute kidney injury) ([place]) - baseline creatinine u/k but patient states no known prior history of renal dysfunction - patient presents with creat 1.2 and elevated BUN; started on IVF and creat has improved as well as BUN - continue diet and encouraging good oral intake  Elevated LFTs -Considered due to acute illness in setting of Covid -Continue trending LFTs; further work-up if further elevation -Continuing to downtrend  Prediabetes -Hyperglycemia suspected from steroid use -Continue SSI and CBG monitoring -Check A1c = 5.8%  History of tobacco use -Continue breathing treatments as needed -Patient no longer endorses tobacco use  Abnormal EKG - chronic RBBB - stable; continue on tele   Diarrhea-resolved as of 05/28/2020 -Improved.  Laxatives were discontinued   Antimicrobials: Tocilizumab 05/23/2020  DVT prophylaxis:    Code Status:   Code Status: Full Code Family Communication: none  Disposition Plan: Status is: Inpatient  Remains inpatient appropriate because:IV treatments appropriate due to intensity of illness or inability to take PO and Inpatient level of care appropriate due to severity of illness   Dispo: The patient is from: Home              Anticipated d/c is to: Home              Anticipated d/c date is: > 3 days              Patient currently is not medically stable to d/c.   Difficult to place patient No  Risk of unplanned readmission score: Unplanned Admission- Pilot do not use: 12.9   Objective: Blood pressure 139/83, pulse 75, temperature 97.9 F (36.6 C), temperature source Oral, resp. rate 18, height 5\' 7"  (1.702 m), weight 102.5 kg, SpO2 94 %.  Examination: General appearance: alert, cooperative and no distress Head: Normocephalic, without obvious abnormality, atraumatic Eyes: EOMI Lungs:  Distant but relatively clear breath sounds, no obvious wheezing or rhonchi Heart: regular rate and rhythm and S1, S2 normal Abdomen: normal findings: bowel sounds normal and soft, non-tender Extremities: Bilateral lower extremity edema noted Skin: mobility and turgor normal Neurologic: Grossly normal  Consultants:     Procedures:     Data Reviewed: I have personally reviewed following labs and imaging studies Results for orders placed or performed during the hospital encounter of 05/22/20 (from the past 24 hour(s))  Glucose, capillary     Status: Abnormal   Collection Time: 05/28/20  5:20 PM  Result Value Ref Range   Glucose-Capillary 129 (H) 70 - 99 mg/dL  Glucose, capillary     Status: Abnormal   Collection Time: 05/28/20  9:26 PM  Result Value Ref Range   Glucose-Capillary 105 (H) 70 - 99 mg/dL  Hemoglobin A1c     Status: Abnormal   Collection Time: 05/29/20  2:25 AM  Result Value Ref Range   Hgb A1c MFr Bld 5.8 (H) 4.8 - 5.6 %   Mean Plasma Glucose 119.76 mg/dL  Procalcitonin     Status: None   Collection Time: 05/29/20  2:25 AM  Result Value Ref Range   Procalcitonin <0.10 ng/mL  C-reactive protein     Status: None   Collection Time: 05/29/20  2:25 AM  Result Value Ref Range   CRP 0.5 <1.0 mg/dL  Lactate dehydrogenase     Status: Abnormal   Collection Time: 05/29/20  2:25 AM  Result Value Ref Range   LDH 466 (H) 98 - 192 U/L  D-dimer, quantitative     Status: Abnormal   Collection Time: 05/29/20  2:25 AM  Result Value Ref Range   D-Dimer, Quant 3.56 (H) 0.00 - 0.50 ug/mL-FEU  Comprehensive metabolic panel     Status: Abnormal   Collection Time: 05/29/20  2:25 AM  Result Value Ref Range   Sodium 138 135 - 145 mmol/L   Potassium 4.5 3.5 - 5.1 mmol/L   Chloride 109 98 - 111 mmol/L   CO2 21 (L) 22 - 32 mmol/L   Glucose, Bld 87 70 - 99 mg/dL   BUN 28 (H) 8 - 23 mg/dL   Creatinine, Ser 0.90 0.61 - 1.24 mg/dL   Calcium 7.8 (L) 8.9 - 10.3 mg/dL   Total Protein  5.2 (L) 6.5 - 8.1 g/dL   Albumin 2.7 (L) 3.5 - 5.0 g/dL   AST 43 (H) 15 - 41 U/L   ALT 166 (H) 0 - 44 U/L   Alkaline Phosphatase 47 38 - 126 U/L   Total Bilirubin 1.2 0.3 - 1.2 mg/dL   GFR, Estimated >60 >60 mL/min   Anion gap 8 5 - 15  Magnesium     Status: None   Collection Time: 05/29/20  2:25 AM  Result Value Ref Range   Magnesium 2.2 1.7 - 2.4 mg/dL  CBC with Differential/Platelet     Status: Abnormal   Collection Time: 05/29/20  2:25 AM  Result Value Ref Range   WBC 13.8 (H) 4.0 - 10.5 K/uL   RBC 4.61 4.22 - 5.81 MIL/uL   Hemoglobin 13.7 13.0 -  17.0 g/dL   HCT 40.7 39.0 - 52.0 %   MCV 88.3 80.0 - 100.0 fL   MCH 29.7 26.0 - 34.0 pg   MCHC 33.7 30.0 - 36.0 g/dL   RDW 14.0 11.5 - 15.5 %   Platelets 281 150 - 400 K/uL   nRBC 0.0 0.0 - 0.2 %   Neutrophils Relative % 73 %   Neutro Abs 10.2 (H) 1.7 - 7.7 K/uL   Lymphocytes Relative 14 %   Lymphs Abs 1.9 0.7 - 4.0 K/uL   Monocytes Relative 6 %   Monocytes Absolute 0.8 0.1 - 1.0 K/uL   Eosinophils Relative 2 %   Eosinophils Absolute 0.2 0.0 - 0.5 K/uL   Basophils Relative 0 %   Basophils Absolute 0.0 0.0 - 0.1 K/uL   Immature Granulocytes 5 %   Abs Immature Granulocytes 0.62 (H) 0.00 - 0.07 K/uL  Heparin level (unfractionated)     Status: None   Collection Time: 05/29/20  2:25 AM  Result Value Ref Range   Heparin Unfractionated 0.60 0.30 - 0.70 IU/mL  Glucose, capillary     Status: None   Collection Time: 05/29/20  7:57 AM  Result Value Ref Range   Glucose-Capillary 78 70 - 99 mg/dL  Heparin level (unfractionated)     Status: None   Collection Time: 05/29/20  8:40 AM  Result Value Ref Range   Heparin Unfractionated 0.70 0.30 - 0.70 IU/mL  Glucose, capillary     Status: Abnormal   Collection Time: 05/29/20 11:44 AM  Result Value Ref Range   Glucose-Capillary 116 (H) 70 - 99 mg/dL    Recent Results (from the past 240 hour(s))  Blood Culture (routine x 2)     Status: None   Collection Time: 05/22/20 11:30 PM    Specimen: BLOOD  Result Value Ref Range Status   Specimen Description   Final    BLOOD RIGHT ANTECUBITAL Performed at [facility], [place] [address], [place], [place] [address]    Special Requests   Final    BOTTLES DRAWN AEROBIC AND ANAEROBIC Blood Culture adequate volume Performed at [facility] [address] [place], [place] [address]    Culture   Final    NO GROWTH 5 DAYS Performed at [facility], [place] [address] [place], [place] [address]    Report Status 05/28/2020 FINAL  Final  Blood Culture (routine x 2)     Status: None   Collection Time: 05/22/20 11:40 PM   Specimen: BLOOD  Result Value Ref Range Status   Specimen Description   Final    BLOOD BLOOD RIGHT HAND Performed at [facility] [address] [place], [place] [address]    Special Requests   Final    BOTTLES DRAWN AEROBIC AND ANAEROBIC Blood Culture adequate volume Performed at [facility] [address], [place], [place] [address]    Culture   Final    NO GROWTH 5 DAYS Performed at [facility], [place] [address], [place],  [address]    Report Status 05/28/2020 FINAL  Final  Resp Panel by RT-PCR (Flu A&B, Covid)     Status: Abnormal   Collection Time: 05/23/20  1:00 AM  Result Value Ref Range Status   SARS Coronavirus 2 by RT PCR POSITIVE (A) NEGATIVE Final    Comment: CRITICAL RESULT CALLED TO, READ BACK BY AND VERIFIED WITH: SCHISSELBEIN S. @ 0303 BY MECIAL J. (NOTE) SARS-CoV-2 target  nucleic acids are DETECTED.  The SARS-CoV-2 RNA is generally detectable in upper respiratory specimens during the acute phase of infection. Positive results are indicative of the presence of the identified virus, but do not rule out bacterial infection or co-infection with other pathogens not detected by the test. Clinical correlation with patient history and other diagnostic information is necessary to determine  patient infection status. The expected result is Negative.  Fact Sheet for Patients: EntrepreneurPulse.com.au  Fact Sheet for Healthcare Providers: IncredibleEmployment.be  This test is not yet approved or cleared by the [place] FDA and  has been authorized for detection and/or diagnosis of SARS-CoV-2 by FDA under an Emergency Use Authorization (EUA).  This EUA will remain in effect (meaning this  test can be used) for the duration of  the COVID-19 declaration under Section 564(b)(1) of the Act, 21 U.S.C. section 360bbb-3(b)(1), unless the authorization is terminated or revoked sooner.     Influenza A by PCR NEGATIVE NEGATIVE Final   Influenza B by PCR NEGATIVE NEGATIVE Final    Comment: (NOTE) The Xpert Xpress SARS-CoV-2/FLU/RSV plus assay is intended as an aid in the diagnosis of influenza from Nasopharyngeal swab specimens and should not be used as a sole basis for treatment. Nasal washings and aspirates are unacceptable for Xpert Xpress SARS-CoV-2/FLU/RSV testing.  Fact Sheet for Patients: EntrepreneurPulse.com.au  Fact Sheet for Healthcare Providers: IncredibleEmployment.be  This test is not yet approved or cleared by the [place] FDA and has been authorized for detection and/or diagnosis of SARS-CoV-2 by FDA under an Emergency Use Authorization (EUA). This EUA will remain in effect (meaning this test can be used) for the duration of the COVID-19 declaration under Section 564(b)(1) of the Act, 21 U.S.C. section 360bbb-3(b)(1), unless the authorization is terminated or revoked.  Performed at [facility], [place] [address], [place], [place] [address]      Radiology Studies: CT ANGIO CHEST PE W OR WO CONTRAST  Addendum Date: 05/28/2020   ADDENDUM REPORT: 05/28/2020 19:15 ADDENDUM: Critical Value/emergent results were called by telephone at the time of interpretation on  05/28/2020 at 7:15 pm to provider Rufina Falco, who verbally acknowledged these results. Electronically Signed   By: Ilona Sorrel M.D.   On: 05/28/2020 19:15   Result Date: 05/28/2020 CLINICAL DATA:  COVID positive. Worsening dyspnea. Elevated D-dimer. EXAM: CT ANGIOGRAPHY CHEST WITH CONTRAST TECHNIQUE: Multidetector CT imaging of the chest was performed using the standard protocol during bolus administration of intravenous contrast. Multiplanar CT image reconstructions and MIPs were obtained to evaluate the vascular anatomy. CONTRAST:  162mL OMNIPAQUE IOHEXOL 350 MG/ML SOLN COMPARISON:  05/22/2020 chest radiograph. FINDINGS: Cardiovascular: The study is high quality for the evaluation of pulmonary embolism. Acute subsegmental pulmonary emboli in the medial left lower lobe (series 5/image 171) and right lower lobe (series 5/images 206 and 213). No central pulmonary emboli. Atherosclerotic nonaneurysmal thoracic aorta. Normal caliber pulmonary arteries. Normal heart size. No significant pericardial fluid/thickening. Mediastinum/Nodes: Hypodense 2.0 cm anterior left thyroid nodule. Unremarkable esophagus. No pathologically enlarged axillary, mediastinal or hilar lymph nodes. Lungs/Pleura: No pneumothorax. No pleural effusion. Extensive patchy ground-glass opacity throughout both lungs, most prominent in the peripheral perilobular lungs. No lung masses or significant pulmonary nodules. Mild centrilobular emphysema. Upper abdomen: No acute abnormality. Musculoskeletal: No aggressive appearing focal osseous lesions. Moderate thoracic spondylosis. Chronic moderate T12 vertebral compression fracture. Review of the MIP images confirms the above findings. IMPRESSION: 1. Acute subsegmental bilateral lower lobe pulmonary emboli. No central pulmonary emboli. 2. Extensive patchy  ground-glass opacity throughout both lungs, most prominent in the peripheral perilobular lungs, compatible with COVID-19 pneumonia. 3. Aortic  Atherosclerosis (ICD10-I70.0) and Emphysema (ICD10-J43.9). Electronically Signed: By: Ilona Sorrel M.D. On: 05/28/2020 18:56   CT ANGIO CHEST PE W OR WO CONTRAST  Final Result  Addendum 1 of 1  ADDENDUM REPORT: 05/28/2020 19:15    ADDENDUM:  Critical Value/emergent results were called by telephone at the time  of interpretation on 05/28/2020 at 7:15 pm to provider Rufina Falco, who verbally acknowledged these results.      Electronically Signed    By: Ilona Sorrel M.D.    On: 05/28/2020 19:15      Final    VAS [place] LOWER EXTREMITY VENOUS (DVT)  Final Result    DG Chest Port 1 View  Final Result      Scheduled Meds: . vitamin C  500 mg Oral Daily  . insulin aspart  0-15 Units Subcutaneous TID WC  . insulin aspart  0-5 Units Subcutaneous QHS  . predniSONE  60 mg Oral QAC breakfast  . zinc sulfate  220 mg Oral Daily   PRN Meds: acetaminophen, albuterol, chlorpheniramine-HYDROcodone, lip balm, ondansetron **OR** ondansetron (ZOFRAN) IV Continuous Infusions: . sodium chloride    . heparin 1,500 Units/hr (05/29/20 0435)     LOS: 6 days  Time spent: Greater than 50% of the 35 minute visit was spent in counseling/coordination of care for the patient as laid out in the A&P.   Dwyane Dee, MD [facility] 05/29/2020, 2:39 PM

## 2020-05-29 NOTE — Progress Notes (Signed)
[place] for Heparin Indication: pulmonary embolus  Allergies  Allergen Reactions  . Niaspan [Niacin Er]     Joint pain    Patient Measurements: Height: 5\' 7"  (170.2 cm) Weight: 102.5 kg (225 lb 15.5 oz) IBW/kg (Calculated) : 66.1 Heparin Dosing Weight: 88.6 kg  Vital Signs: Temp: 97.6 F (36.4 C) (02/16 2023) Temp Source: Oral (02/16 2023) BP: 135/85 (02/16 2023) Pulse Rate: 67 (02/16 2023)  Labs: Recent Labs    05/26/20 0433 05/27/20 0435 05/28/20 0357 05/29/20 0225  HGB 13.5 12.8*  --  13.7  HCT 41.3 38.6*  --  40.7  PLT 360 283  --  281  HEPARINUNFRC  --   --   --  0.60  CREATININE 0.95 1.08 0.99 0.90    Estimated Creatinine Clearance: 98.4 mL/min (by C-G formula based on SCr of 0.9 mg/dL).   Medical History: Past Medical History:  Diagnosis Date  . Allergy   . Hyperlipidemia    no meds  . 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 Umbilical hernia     Medications:  Scheduled:  . vitamin C  500 mg Oral Daily  . insulin aspart  0-15 Units Subcutaneous TID WC  . insulin aspart  0-5 Units Subcutaneous QHS  . predniSONE  60 mg Oral QAC breakfast  . zinc sulfate  220 mg Oral Daily   Infusions:  . sodium chloride 75 mL/hr at 05/28/20 2251  . heparin 1,500 Units/hr (05/28/20 1946)   PRN: acetaminophen, albuterol, chlorpheniramine-HYDROcodone, lip balm, ondansetron **OR** ondansetron (ZOFRAN) IV  Assessment: 60 yo male with COVID-19 pneumonia now with acute subsegmental bilateral lower lobe PE.  Pharmacy consulted to dose IV heparin.  Patient has been on lovenox 40mg  SQ q24h, most recent dose 2/16 at 10:49.  05/29/2020:  Initial heparin level 0.6- therapeutic on IV heparin 1500 units/hr  CBC WNL  No bleeding or infusion related concerns per RN  Goal of Therapy:  Heparin level 0.3-0.7 units/ml Monitor platelets by anticoagulation protocol: Yes   Plan:  Continue  Heparin 1500 units/hr IV infusion Recheck heparin level in 6hrs Daily heparin level and CBC  Netta Cedars, PharmD, BCPS Pharmacy: [phone] 05/29/2020,3:24 AM

## 2020-05-29 NOTE — Progress Notes (Signed)
[place] for Heparin Indication: pulmonary embolus  Allergies  Allergen Reactions  . Niaspan [Niacin Er]     Joint pain    Patient Measurements: Height: 5\' 7"  (170.2 cm) Weight: 102.5 kg (225 lb 15.5 oz) IBW/kg (Calculated) : 66.1 Heparin Dosing Weight: 88.6 kg  Vital Signs: Temp: 97.7 F (36.5 C) (02/17 0515) BP: 128/71 (02/17 0515) Pulse Rate: 55 (02/17 0515)  Labs: Recent Labs    05/27/20 0435 05/28/20 0357 05/29/20 0225 05/29/20 0840  HGB 12.8*  --  13.7  --   HCT 38.6*  --  40.7  --   PLT 283  --  281  --   HEPARINUNFRC  --   --  0.60 0.70  CREATININE 1.08 0.99 0.90  --     Estimated Creatinine Clearance: 98.4 mL/min (by C-G formula based on SCr of 0.9 mg/dL).   Assessment: 60 yo male with COVID-19 pneumonia now with acute subsegmental bilateral lower lobe PE.  Pharmacy consulted to dose IV heparin.  Patient has been on lovenox 40mg  SQ q24h, most recent dose 2/16 at 10:49.  05/29/2020:  Initial heparin level 0.6- therapeutic on IV heparin 1500 units/hr  Confirmatory heparin level 0.7 - remains therapeutic on heparin at 1500 units/hr  CBC WNL  No bleeding or infusion related concerns per RN  Goal of Therapy:  Heparin level 0.3-0.7 units/ml Monitor platelets by anticoagulation protocol: Yes   Plan:  Continue Heparin 1500 units/hr IV infusion Daily heparin level and CBC F/u for transition to [place], Pharm.D 05/29/2020 9:39 AM Pharmacy: [phone] 05/29/2020,9:37 AM

## 2020-05-29 NOTE — Assessment & Plan Note (Signed)
-  Diagnosed on CTA chest on 05/28/2020 -Started on heparin drip -Obtain echo -Transitioned to Eliquis off of heparin on 05/30/2020.  Prescriptions sent

## 2020-05-29 NOTE — Telephone Encounter (Signed)
LMTCB

## 2020-05-29 NOTE — Telephone Encounter (Signed)
Barbaraann Rondo from [facility] called to scheduled hosp follow up please call back

## 2020-05-30 ENCOUNTER — Telehealth: Payer: Self-pay | Admitting: Nurse Practitioner

## 2020-05-30 ENCOUNTER — Other Ambulatory Visit (HOSPITAL_COMMUNITY): Payer: 59

## 2020-05-30 DIAGNOSIS — R7989 Other specified abnormal findings of blood chemistry: Secondary | ICD-10-CM

## 2020-05-30 LAB — CBC WITH DIFFERENTIAL/PLATELET
Abs Immature Granulocytes: 0.55 10*3/uL — ABNORMAL HIGH (ref 0.00–0.07)
Basophils Absolute: 0.1 10*3/uL (ref 0.0–0.1)
Basophils Relative: 0 %
Eosinophils Absolute: 0.3 10*3/uL (ref 0.0–0.5)
Eosinophils Relative: 2 %
HCT: 41.3 % (ref 39.0–52.0)
Hemoglobin: 13.7 g/dL (ref 13.0–17.0)
Immature Granulocytes: 4 %
Lymphocytes Relative: 13 %
Lymphs Abs: 1.8 10*3/uL (ref 0.7–4.0)
MCH: 29.7 pg (ref 26.0–34.0)
MCHC: 33.2 g/dL (ref 30.0–36.0)
MCV: 89.6 fL (ref 80.0–100.0)
Monocytes Absolute: 0.9 10*3/uL (ref 0.1–1.0)
Monocytes Relative: 6 %
Neutro Abs: 10.3 10*3/uL — ABNORMAL HIGH (ref 1.7–7.7)
Neutrophils Relative %: 75 %
Platelets: 238 10*3/uL (ref 150–400)
RBC: 4.61 MIL/uL (ref 4.22–5.81)
RDW: 14.5 % (ref 11.5–15.5)
WBC: 13.9 10*3/uL — ABNORMAL HIGH (ref 4.0–10.5)
nRBC: 0 % (ref 0.0–0.2)

## 2020-05-30 LAB — COMPREHENSIVE METABOLIC PANEL
ALT: 152 U/L — ABNORMAL HIGH (ref 0–44)
AST: 45 U/L — ABNORMAL HIGH (ref 15–41)
Albumin: 2.7 g/dL — ABNORMAL LOW (ref 3.5–5.0)
Alkaline Phosphatase: 52 U/L (ref 38–126)
Anion gap: 7 (ref 5–15)
BUN: 26 mg/dL — ABNORMAL HIGH (ref 8–23)
CO2: 23 mmol/L (ref 22–32)
Calcium: 7.5 mg/dL — ABNORMAL LOW (ref 8.9–10.3)
Chloride: 109 mmol/L (ref 98–111)
Creatinine, Ser: 0.99 mg/dL (ref 0.61–1.24)
GFR, Estimated: >60 mL/min (ref >60)
Glucose, Bld: 92 mg/dL (ref 70–99)
Potassium: 4.1 mmol/L (ref 3.5–5.1)
Sodium: 139 mmol/L (ref 135–145)
Total Bilirubin: 0.6 mg/dL (ref 0.3–1.2)
Total Protein: 5 g/dL — ABNORMAL LOW (ref 6.5–8.1)

## 2020-05-30 LAB — D-DIMER, QUANTITATIVE: D-Dimer, Quant: 3.2 ug/mL-FEU — ABNORMAL HIGH (ref 0.00–0.50)

## 2020-05-30 LAB — GLUCOSE, CAPILLARY
Glucose-Capillary: 75 mg/dL (ref 70–99)
Glucose-Capillary: 103 mg/dL — ABNORMAL HIGH (ref 70–99)

## 2020-05-30 LAB — MAGNESIUM: Magnesium: 2.4 mg/dL (ref 1.7–2.4)

## 2020-05-30 LAB — HEPARIN LEVEL (UNFRACTIONATED): Heparin Unfractionated: 0.82 IU/mL — ABNORMAL HIGH (ref 0.30–0.70)

## 2020-05-30 LAB — C-REACTIVE PROTEIN: CRP: 1.1 mg/dL — ABNORMAL HIGH (ref <1.0)

## 2020-05-30 LAB — LACTATE DEHYDROGENASE: LDH: 479 U/L — ABNORMAL HIGH (ref 98–192)

## 2020-05-30 LAB — PROCALCITONIN: Procalcitonin: <0.1 ng/mL

## 2020-05-30 MED ORDER — HEPARIN (PORCINE) 25000 UT/250ML-% IV SOLN
1350.0000 [IU]/h | INTRAVENOUS | Status: AC
  Administered 2020-05-30: 1350 [IU]/h via INTRAVENOUS
  Filled 2020-05-30: qty 250

## 2020-05-30 MED ORDER — PREDNISONE 20 MG PO TABS: 40.0000 mg | ORAL_TABLET | Freq: Every day | ORAL | 0 refills | Status: AC

## 2020-05-30 MED ORDER — APIXABAN 5 MG PO TABS: ORAL_TABLET | ORAL | 4 refills | Status: DC

## 2020-05-30 MED ORDER — APIXABAN 5 MG PO TABS
10.0000 mg | ORAL_TABLET | Freq: Two times a day (BID) | ORAL | Status: DC
  Administered 2020-05-30: 10 mg via ORAL
  Filled 2020-05-30: qty 2

## 2020-05-30 MED ORDER — APIXABAN 5 MG PO TABS: 5.0000 mg | ORAL_TABLET | Freq: Two times a day (BID) | ORAL | Status: DC

## 2020-05-30 NOTE — Telephone Encounter (Signed)
Barbaraann Rondo at [facility] trying to schedule a hospital follow up for patient.  Correct number to call is [phone]  If you could leave the date and time of the hospital f/u on the message he will relay to the patient.

## 2020-05-30 NOTE — Discharge Instructions (Addendum)
Information on my medicine - ELIQUIS (apixaban)  This medication education was reviewed with me or my healthcare representative as part of my discharge preparation.   Why was Eliquis prescribed for you? Eliquis was prescribed to treat blood clots that may have been found in the veins of your legs (deep vein thrombosis) or in your lungs (pulmonary embolism) and to reduce the risk of them occurring again.  What do You need to know about Eliquis ? The starting dose is 10 mg (two 5 mg tablets) taken TWICE daily for the FIRST SEVEN (7) DAYS, then on Friday 2/25  the dose is reduced to ONE 5 mg tablet taken TWICE daily.  Eliquis may be taken with or without food.   Try to take the dose about the same time in the morning and in the evening. If you have difficulty swallowing the tablet whole please discuss with your pharmacist how to take the medication safely.  Take Eliquis exactly as prescribed and DO NOT stop taking Eliquis without talking to the doctor who prescribed the medication.  Stopping may increase your risk of developing a new blood clot.  Refill your prescription before you run out.  After discharge, you should have regular check-up appointments with your healthcare provider that is prescribing your Eliquis.    What do you do if you miss a dose? If a dose of ELIQUIS is not taken at the scheduled time, take it as soon as possible on the same day and twice-daily administration should be resumed. The dose should not be doubled to make up for a missed dose.  Important Safety Information A possible side effect of Eliquis is bleeding. You should call your healthcare provider right away if you experience any of the following: ? Bleeding from an injury or your nose that does not stop. ? Unusual colored urine (red or dark brown) or unusual colored stools (red or black). ? Unusual bruising for unknown reasons. ? A serious fall or if you hit your head (even if there is no  bleeding).  Some medicines may interact with Eliquis and might increase your risk of bleeding or clotting while on Eliquis. To help avoid this, consult your healthcare provider or pharmacist prior to using any new prescription or non-prescription medications, including herbals, vitamins, non-steroidal anti-inflammatory drugs (NSAIDs) and supplements.  This website has more information on Eliquis (apixaban): http://www.eliquis.com/eliquis/home

## 2020-05-30 NOTE — Progress Notes (Signed)
Patient will be discharging with family later today. Oxygen education and medication education will be provided.

## 2020-05-30 NOTE — Telephone Encounter (Signed)
Pt needs hospital fu

## 2020-05-30 NOTE — Telephone Encounter (Signed)
appt made by Abigail Butts

## 2020-05-30 NOTE — Telephone Encounter (Signed)
Hospital follow up scheduled with Shelah Lewandowsky on 06/06/20 @ 8:30am.

## 2020-05-30 NOTE — Progress Notes (Signed)
SATURATION QUALIFICATIONS: (This note is used to comply with regulatory documentation for home oxygen)  Patient Saturations on Room Air at Rest = 86%  Patient Saturations on Room Air while Ambulating = 85%  Patient Saturations on 4 Liters of oxygen while Ambulating = 90%  Please briefly explain why patient needs home oxygen:Patient will benefit from home oxygen to keep O2 sats >88%.

## 2020-05-30 NOTE — Progress Notes (Signed)
[place] for Heparin>> Eliquis Indication: pulmonary embolus  Allergies  Allergen Reactions  . Niaspan [Niacin Er]     Joint pain    Patient Measurements: Height: 5\' 7"  (170.2 cm) Weight: 102.5 kg (225 lb 15.5 oz) IBW/kg (Calculated) : 66.1 Heparin Dosing Weight: 88.6 kg  Vital Signs: Temp: 98.1 F (36.7 C) (02/18 0430) Temp Source: Oral (02/17 2028) BP: 121/76 (02/18 0430) Pulse Rate: 63 (02/18 0430)  Labs: Recent Labs    05/28/20 0357 05/29/20 0225 05/29/20 0840 05/30/20 0331  HGB  --  13.7  --  13.7  HCT  --  40.7  --  41.3  PLT  --  281  --  238  HEPARINUNFRC  --  0.60 0.70 0.82*  CREATININE 0.99 0.90  --  0.99    Estimated Creatinine Clearance: 89.4 mL/min (by C-G formula based on SCr of 0.99 mg/dL).   Assessment: 60 yo male with COVID-19 pneumonia now with acute subsegmental bilateral lower lobe PE.  Pharmacy consulted to dose IV heparin.  Patient has been on lovenox 40mg  SQ q24h, most recent dose 2/16 at 10:49.  05/30/2020:  Heparin level = 0.82 (supratherapeutic) with heparin gtt @ 1500 units/hr  CBC WNL  No bleeding or infusion related complications noted  To transition to Eliquis  Goal of Therapy:  Heparin level 0.3-0.7 units/ml Monitor platelets by anticoagulation protocol: Yes   Plan:  DC heparin drip Eliquis 10 mg po BID x 7 days followed by Eliquis 5 mg po BID Will give pt 30 day free card & educate prior to discharge  Eudelia Bunch, Pharm.D 05/30/2020 8:00 AM

## 2020-05-30 NOTE — TOC Transition Note (Signed)
Transition of Care [facility]) - CM/SW Discharge Note   Patient Details  Name: Jeffrey Abbott MRN: [id number] Date of Birth: November 06, 1960  Transition of [facility]) CM/SW Contact:  Trish Mage, LCSW Phone Number: 05/30/2020, 10:20 AM   Clinical Narrative:   Patient who is stable for discharge today is in need of hospital follow-up appointment, home O2, has a ride home.  He will follow up at [facility] a week from today.  SAT note and order seen and appreciated.  Contacted Caryl Pina with [employer] who will arrange for delivery of travel canister, home unit.  No further needs identified.  TOC sign off.    Final next level of care: Home/Self Care Barriers to Discharge: No Barriers Identified   Patient Goals and CMS Choice        Discharge Placement                       Discharge Plan and Services                                     Social Determinants of Health (SDOH) Interventions     Readmission Risk Interventions No flowsheet data found.

## 2020-05-30 NOTE — Progress Notes (Signed)
Michie for Heparin Indication: pulmonary embolus  Allergies  Allergen Reactions  . Niaspan [Niacin Er]     Joint pain    Patient Measurements: Height: 5\' 7"  (170.2 cm) Weight: 102.5 kg (225 lb 15.5 oz) IBW/kg (Calculated) : 66.1 Heparin Dosing Weight: 88.6 kg  Vital Signs: Temp: 98.2 F (36.8 C) (02/17 2028) Temp Source: Oral (02/17 2028) BP: 146/73 (02/17 2028) Pulse Rate: 70 (02/17 2028)  Labs: Recent Labs    05/28/20 0357 05/29/20 0225 05/29/20 0840 05/30/20 0331  HGB  --  13.7  --  13.7  HCT  --  40.7  --  41.3  PLT  --  281  --  238  HEPARINUNFRC  --  0.60 0.70 0.82*  CREATININE 0.99 0.90  --  0.99    Estimated Creatinine Clearance: 89.4 mL/min (by C-G formula based on SCr of 0.99 mg/dL).   Assessment: 60 yo male with COVID-19 pneumonia now with acute subsegmental bilateral lower lobe PE.  Pharmacy consulted to dose IV heparin.  Patient has been on lovenox 40mg  SQ q24h, most recent dose 2/16 at 10:49.  05/30/2020:  Heparin level = 0.82 (supratherapeutic) with heparin gtt @ 1500 units/hr  CBC WNL  No bleeding or infusion related complications noted  Goal of Therapy:  Heparin level 0.3-0.7 units/ml Monitor platelets by anticoagulation protocol: Yes   Plan:  Decrease Heparin to 1350 units/hr IV infusion Check heparin level 6 hr after rate decrease Daily heparin level and CBC F/u for transition to [facility], PharmD 05/30/2020 5:18 AM

## 2020-05-30 NOTE — Discharge Summary (Signed)
Physician Discharge Summary   Jeffrey Abbott GGE:[id number] DOB: Jul 24, 1960 DOA: 05/22/2020  PCP: Chevis Pretty, FNP  Admit date: 05/22/2020 Discharge date: 05/30/2020  Admitted From: home Disposition:  home Discharging physician: Dwyane Dee, MD  Recommendations for Outpatient Follow-up:  1. Echo was ordered but delayed on obtaining prior to discharge; if unable to have been completed prior to discharge, may need echo outpatient for baseline  2. If prolonged O2 weaning or unable to wean, may need referral to pulmonology to determine if Eliquis is extended > 57mths 3. Repeat CMP due to elevated LFTs (downtrending at discharge)  Equipment/Devices: Home O2, 4L  Patient discharged to home in Discharge Condition: stable Risk of unplanned readmission score: Unplanned Admission- Pilot do not use: 12.98  CODE STATUS: Full Diet recommendation:  Diet Orders (From admission, onward)    Start     Ordered   05/23/20 0449  Diet regular Room service appropriate? Yes; Fluid consistency: Thin  Diet effective now       Question Answer Comment  Room service appropriate? Yes   Fluid consistency: Thin      05/23/20 0448          Hospital Course: Jeffrey Abbott is a 60 yo male with PMH HLD, prior tobacco use, chronic RBBB who presented to the hospital due to worsening shortness of breath at home.  He was evaluated outpatient by telemetry medicine on 05/20/2020; he had reported that he tested positive on a home Covid test on 05/11/2020. He was hypoxic on admission and O2 requirements escalated quickly from 6 L [place] to 12 L [place] HFNC.  He was treated with tocilizumab on 05/23/20 and started on steroids.   Due to ongoing oxygen demand and uptrending D-dimer, he underwent CTA chest on 05/28/2020.  This revealed bilateral PE and he was started on a heparin drip. His Hgb remained stable and he was transitioned to Eliquis on 2/18. He will follow up with his PCP at discharge and was informed he will  remain on treatment for at least 3 months.   He was able to be further weaned on oxygen and was weaned down to 4 L nasal cannula prior to discharge.  Home O2 was also arranged. Prednisone course was continued at discharge to finish completion. He was stable and amenable for discharging home as well.   * Pneumonia due to COVID-19 virus - patient states positive home test on 05/11/20; positive on admission on 2/11 as well - s/p tocilizumab on 05/23/20 - continue steroids.  Discharged on course to finish - continue O2.  On 4 L - negative LE duplex on 05/27/20. Positive for B/L PE on 05/28/20 -Procalcitonin negative x3  Pulmonary embolism ([place]) -Diagnosed on CTA chest on 05/28/2020 -Started on heparin drip -Obtain echo -Transitioned to Eliquis off of heparin on 05/30/2020.  Prescriptions sent  Acute respiratory failure with hypoxia ([place]) -Considered due to COVID-19 pneumonia and now B/L PE diagnosed on 05/28/20 -Continue weaning oxygen as able; discharging home on 4 L oxygen -Continue incentive spirometer  AKI (acute kidney injury) (H[facility])-resolved as of 05/30/2020 - baseline creatinine u/k but patient states no known prior history of renal dysfunction - patient presents with creat 1.2 and elevated BUN; started on IVF and creat has improved as well as BUN -Creatinine normalized prior to discharge  Elevated LFTs -Considered due to acute illness in setting of Covid -Continuing to downtrend -Repeat CMP at follow-up  Prediabetes -Hyperglycemia suspected from steroid use -Continue SSI and CBG monitoring -Check A1c =  5.8%  History of tobacco use -Continue breathing treatments as needed -Patient no longer endorses tobacco use  Abnormal EKG - chronic RBBB - stable; continue on tele   Diarrhea-resolved as of 05/28/2020 -Improved.  Laxatives were discontinued    The patient's chronic medical conditions were treated accordingly per the patient's home medication regimen except as noted.   On day of discharge, patient was felt deemed stable for discharge. Patient/family member advised to call PCP or come back to ER if needed.   Principal Diagnosis: Pneumonia due to COVID-19 virus  Discharge Diagnoses: Active Hospital Problems   Diagnosis Date Noted  . Pneumonia due to COVID-19 virus 05/23/2020    Priority: High  . Pulmonary embolism ([place]) 05/29/2020    Priority: High  . Acute respiratory failure with hypoxia ([place]) 05/23/2020    Priority: High  . Elevated LFTs 05/28/2020    Priority: Low  . History of tobacco use 05/28/2020  . Prediabetes 05/28/2020  . Abnormal EKG 10/13/2011    Resolved Hospital Problems   Diagnosis Date Noted Date Resolved  . AKI (acute kidney injury) ([place]) 05/28/2020 05/30/2020    Priority: Medium  . Diarrhea 05/28/2020 05/28/2020    Discharge Instructions    For home use only DME oxygen   Complete by: As directed    Length of Need: 6 Months   Mode or (Route): Nasal cannula   Liters per Minute: 4   Frequency: Continuous (stationary and portable oxygen unit needed)   Oxygen delivery system: Gas   Increase activity slowly   Complete by: As directed      Allergies as of 05/30/2020      Reactions   Niaspan [niacin Er]    Joint pain      Medication List    STOP taking these medications   azithromycin 250 MG tablet Commonly known as: Zithromax     TAKE these medications   acetaminophen 325 MG tablet Commonly known as: TYLENOL Take 650 mg by mouth every 6 (six) hours as needed for mild pain, fever or headache.   apixaban 5 MG Tabs tablet Commonly known as: ELIQUIS Take 2 pills in the morning and 2 pills in the evening until 2/24. Then on 2/25 start taking 1 pill in the morning and 1 pill in the evening.   benzonatate 100 MG capsule Commonly known as: Tessalon Perles Take 1 capsule (100 mg total) by mouth 3 (three) times daily as needed for cough.   ondansetron 4 MG tablet Commonly known as: Zofran Take 1 tablet (4 mg total)  by mouth every 8 (eight) hours as needed for nausea or vomiting.   predniSONE 20 MG tablet Commonly known as: DELTASONE Take 2 tablets (40 mg total) by mouth daily before breakfast for 3 days. Start taking on: May 31, 2020   VITAMIN C PO Take 1 tablet by mouth daily.   VITAMIN D PO Take 1 capsule by mouth daily.   ZINC PO Take 1 tablet by mouth daily.            Durable Medical Equipment  (From admission, onward)         Start     Ordered   05/30/20 0000  For home use only DME oxygen       Question Answer Comment  Length of Need 6 Months   Mode or (Route) Nasal cannula   Liters per Minute 4   Frequency Continuous (stationary and portable oxygen unit needed)   Oxygen delivery system Gas  05/30/20 1122          Follow-up Information    [facility] Follow up on 06/06/2020.   Specialty: Family Medicine Why: Friday at 8:30 with Ms Hassell Done for your hospital follow up appointment Contact information: [address] [employer] Follow up.   Why: This is your [employer] company Contact information: [place] [address] [phone]              Allergies  Allergen Reactions  . Niaspan [Niacin Er]     Joint pain    Consultations:   Discharge Exam: BP 134/73 (BP Location: Left Arm)   Pulse 80   Temp 98.1 F (36.7 C) (Oral)   Resp 18   Ht 5\' 7"  (1.702 m)   Wt 102.5 kg   SpO2 96%   BMI 35.39 kg/m  General appearance: alert, cooperative and no distress Head: Normocephalic, without obvious abnormality, atraumatic Eyes: EOMI Lungs: Distant but relatively clear breath sounds, no obvious wheezing or rhonchi Heart: regular rate and rhythm and S1, S2 normal Abdomen: normal findings: bowel sounds normal and soft, non-tender Extremities: Bilateral lower extremity edema noted Skin: mobility and turgor normal Neurologic: Grossly  normal  The results of significant diagnostics from this hospitalization (including imaging, microbiology, ancillary and laboratory) are listed below for reference.   Microbiology: Recent Results (from the past 240 hour(s))  Blood Culture (routine x 2)     Status: None   Collection Time: 05/22/20 11:30 PM   Specimen: BLOOD  Result Value Ref Range Status   Specimen Description   Final    BLOOD RIGHT ANTECUBITAL Performed at [facility] [address], [place], [place] [address]    Special Requests   Final    BOTTLES DRAWN AEROBIC AND ANAEROBIC Blood Culture adequate volume Performed at [facility] [address], [place], [place] [address]    Culture   Final    NO GROWTH 5 DAYS Performed at [facility], [place] [address] [place], [place] [address]    Report Status 05/28/2020 FINAL  Final  Blood Culture (routine x 2)     Status: None   Collection Time: 05/22/20 11:40 PM   Specimen: BLOOD  Result Value Ref Range Status   Specimen Description   Final    BLOOD BLOOD RIGHT HAND Performed at [facility] [address], [place], [place] [address]    Special Requests   Final    BOTTLES DRAWN AEROBIC AND ANAEROBIC Blood Culture adequate volume Performed at [facility] [address], [place], [place] [address]    Culture   Final    NO GROWTH 5 DAYS Performed at [facility], [place] [address], [place], [place] [address]    Report Status 05/28/2020 FINAL  Final  Resp Panel by RT-PCR (Flu A&B, Covid)     Status: Abnormal   Collection Time: 05/23/20  1:00 AM  Result Value Ref Range Status   SARS Coronavirus 2 by RT PCR POSITIVE (A) NEGATIVE Final    Comment: CRITICAL RESULT CALLED TO, READ BACK BY AND VERIFIED WITH: SCHISSELBEIN S. @ 0303 BY MECIAL J. (NOTE) SARS-CoV-2 target nucleic acids are DETECTED.  The SARS-CoV-2 RNA is generally detectable in upper respiratory specimens during the  acute phase of infection. Positive results are indicative of the presence of the identified virus,  but do not rule out bacterial infection or co-infection with other pathogens not detected by the test. Clinical correlation with patient history and other diagnostic information is necessary to determine patient infection status. The expected result is Negative.  Fact Sheet for Patients: EntrepreneurPulse.com.au  Fact Sheet for Healthcare Providers: IncredibleEmployment.be  This test is not yet approved or cleared by the [place] FDA and  has been authorized for detection and/or diagnosis of SARS-CoV-2 by FDA under an Emergency Use Authorization (EUA).  This EUA will remain in effect (meaning this  test can be used) for the duration of  the COVID-19 declaration under Section 564(b)(1) of the Act, 21 U.S.C. section 360bbb-3(b)(1), unless the authorization is terminated or revoked sooner.     Influenza A by PCR NEGATIVE NEGATIVE Final   Influenza B by PCR NEGATIVE NEGATIVE Final    Comment: (NOTE) The Xpert Xpress SARS-CoV-2/FLU/RSV plus assay is intended as an aid in the diagnosis of influenza from Nasopharyngeal swab specimens and should not be used as a sole basis for treatment. Nasal washings and aspirates are unacceptable for Xpert Xpress SARS-CoV-2/FLU/RSV testing.  Fact Sheet for Patients: EntrepreneurPulse.com.au  Fact Sheet for Healthcare Providers: IncredibleEmployment.be  This test is not yet approved or cleared by the [place] FDA and has been authorized for detection and/or diagnosis of SARS-CoV-2 by FDA under an Emergency Use Authorization (EUA). This EUA will remain in effect (meaning this test can be used) for the duration of the COVID-19 declaration under Section 564(b)(1) of the Act, 21 U.S.C. section 360bbb-3(b)(1), unless the authorization is terminated or revoked.  Performed at  [facility], [place] [address] [place], [place] [address]      Labs: BNP (last 3 results) No results for input(s): BNP in the last 8760 hours. Basic Metabolic Panel: Recent Labs  Lab 05/24/20 0349 05/25/20 0316 05/26/20 0433 05/27/20 0435 05/28/20 0357 05/29/20 0225 05/30/20 0331  NA 141 143 142 139 141 138 139  K 4.3 4.3 4.6 4.6 4.7 4.5 4.1  CL 106 109 111 110 112* 109 109  CO2 25 23 22  20* 21* 21* 23  GLUCOSE 140* 151* 143* 144* 114* 87 92  BUN 30* 38* 40* 37* 35* 28* 26*  CREATININE 0.89 1.03 0.95 1.08 0.99 0.90 0.99  CALCIUM 8.4* 8.4* 8.0* 7.7* 7.8* 7.8* 7.5*  MG 2.4 2.6* 2.5*  --   --  2.2 2.4   Liver Function Tests: Recent Labs  Lab 05/26/20 0433 05/27/20 0435 05/28/20 0357 05/29/20 0225 05/30/20 0331  AST 59* 77* 67* 43* 45*  ALT 102* 161* 197* 166* 152*  ALKPHOS 42 43 49 47 52  BILITOT 1.2 1.3* 1.1 1.2 0.6  PROT 5.8* 5.4* 5.3* 5.2* 5.0*  ALBUMIN 2.7* 2.7* 2.6* 2.7* 2.7*   No results for input(s): LIPASE, AMYLASE in the last 168 hours. No results for input(s): AMMONIA in the last 168 hours. CBC: Recent Labs  Lab 05/25/20 0316 05/26/20 0433 05/27/20 0435 05/29/20 0225 05/30/20 0331  WBC 14.4* 14.6* 14.5* 13.8* 13.9*  NEUTROABS 12.4* 12.2* 12.0* 10.2* 10.3*  HGB 13.1 13.5 12.8* 13.7 13.7  HCT 41.0 41.3 38.6* 40.7 41.3  MCV 89.9 90.8 88.5 88.3 89.6  PLT 327 360 283 281 238   Cardiac Enzymes: No results for input(s): CKTOTAL, CKMB, CKMBINDEX, TROPONINI in the last 168 hours. BNP: Invalid input(s): POCBNP CBG: Recent Labs  Lab 05/29/20 1144 05/29/20 1655 05/29/20 2110 05/30/20 0739 05/30/20 1133  GLUCAP 116* 128* 121* 75 103*   D-Dimer  Recent Labs    05/29/20 0225 05/30/20 0331  DDIMER 3.56* 3.20*   Hgb A1c Recent Labs    05/29/20 0225  HGBA1C 5.8*   Lipid Profile No results for input(s): CHOL, HDL, LDLCALC, TRIG, CHOLHDL, LDLDIRECT in the last 72 hours. Thyroid function studies No results for input(s): TSH,  T4TOTAL, T3FREE, THYROIDAB in the last 72 hours.  Invalid input(s): FREET3 Anemia work up No results for input(s): VITAMINB12, FOLATE, FERRITIN, TIBC, IRON, RETICCTPCT in the last 72 hours. Urinalysis No results found for: COLORURINE, APPEARANCEUR, [place], [place], [place], [place], [place], [place], PROTEINUR, UROBILINOGEN, NITRITE, LEUKOCYTESUR Sepsis Labs Invalid input(s): PROCALCITONIN,  WBC,  LACTICIDVEN Microbiology Recent Results (from the past 240 hour(s))  Blood Culture (routine x 2)     Status: None   Collection Time: 05/22/20 11:30 PM   Specimen: BLOOD  Result Value Ref Range Status   Specimen Description   Final    BLOOD RIGHT ANTECUBITAL Performed at [facility] [address] [place], [place] [address]    Special Requests   Final    BOTTLES DRAWN AEROBIC AND ANAEROBIC Blood Culture adequate volume Performed at [facility] [address] [place], [place] [address]    Culture   Final    NO GROWTH 5 DAYS Performed at [facility], [place] [address] [place], [place] [address]    Report Status 05/28/2020 FINAL  Final  Blood Culture (routine x 2)     Status: None   Collection Time: 05/22/20 11:40 PM   Specimen: BLOOD  Result Value Ref Range Status   Specimen Description   Final    BLOOD BLOOD RIGHT HAND Performed at [facility] [address] [place], [place] [address]    Special Requests   Final    BOTTLES DRAWN AEROBIC AND ANAEROBIC Blood Culture adequate volume Performed at [facility] [address] [place], [place] [address]    Culture   Final    NO GROWTH 5 DAYS Performed at [facility], [place] [address] [place], [place] [address]    Report Status 05/28/2020 FINAL  Final  Resp Panel by RT-PCR (Flu A&B, Covid)     Status: Abnormal   Collection Time: 05/23/20  1:00 AM  Result Value Ref Range Status   SARS Coronavirus 2 by RT PCR POSITIVE (A) NEGATIVE  Final    Comment: CRITICAL RESULT CALLED TO, READ BACK BY AND VERIFIED WITH: SCHISSELBEIN S. @ 0303 BY MECIAL J. (NOTE) SARS-CoV-2 target nucleic acids are DETECTED.  The SARS-CoV-2 RNA is generally detectable in upper respiratory specimens during the acute phase of infection. Positive results are indicative of the presence of the identified virus, but do not rule out bacterial infection or co-infection with other pathogens not detected by the test. Clinical correlation with patient history and other diagnostic information is necessary to determine patient infection status. The expected result is Negative.  Fact Sheet for Patients: EntrepreneurPulse.com.au  Fact Sheet for Healthcare Providers: IncredibleEmployment.be  This test is not yet approved or cleared by the [place] FDA and  has been authorized for detection and/or diagnosis of SARS-CoV-2 by FDA under an Emergency Use Authorization (EUA).  This EUA will remain in effect (meaning this  test can be used) for the duration of  the COVID-19 declaration under Section 564(b)(1) of the Act, 21 U.S.C. section 360bbb-3(b)(1), unless the authorization is terminated or revoked sooner.     Influenza A by PCR NEGATIVE NEGATIVE Final  Influenza B by PCR NEGATIVE NEGATIVE Final    Comment: (NOTE) The Xpert Xpress SARS-CoV-2/FLU/RSV plus assay is intended as an aid in the diagnosis of influenza from Nasopharyngeal swab specimens and should not be used as a sole basis for treatment. Nasal washings and aspirates are unacceptable for Xpert Xpress SARS-CoV-2/FLU/RSV testing.  Fact Sheet for Patients: EntrepreneurPulse.com.au  Fact Sheet for Healthcare Providers: IncredibleEmployment.be  This test is not yet approved or cleared by the [place] FDA and has been authorized for detection and/or diagnosis of SARS-CoV-2 by FDA under an Emergency Use  Authorization (EUA). This EUA will remain in effect (meaning this test can be used) for the duration of the COVID-19 declaration under Section 564(b)(1) of the Act, 21 U.S.C. section 360bbb-3(b)(1), unless the authorization is terminated or revoked.  Performed at [facility], [place] [address] [place], [place] [address]     Procedures/Studies: CT ANGIO CHEST PE W OR WO CONTRAST  Addendum Date: 05/28/2020   ADDENDUM REPORT: 05/28/2020 19:15 ADDENDUM: Critical Value/emergent results were called by telephone at the time of interpretation on 05/28/2020 at 7:15 pm to provider Rufina Falco, who verbally acknowledged these results. Electronically Signed   By: Ilona Sorrel M.D.   On: 05/28/2020 19:15   Result Date: 05/28/2020 CLINICAL DATA:  COVID positive. Worsening dyspnea. Elevated D-dimer. EXAM: CT ANGIOGRAPHY CHEST WITH CONTRAST TECHNIQUE: Multidetector CT imaging of the chest was performed using the standard protocol during bolus administration of intravenous contrast. Multiplanar CT image reconstructions and MIPs were obtained to evaluate the vascular anatomy. CONTRAST:  132mL OMNIPAQUE IOHEXOL 350 MG/ML SOLN COMPARISON:  05/22/2020 chest radiograph. FINDINGS: Cardiovascular: The study is high quality for the evaluation of pulmonary embolism. Acute subsegmental pulmonary emboli in the medial left lower lobe (series 5/image 171) and right lower lobe (series 5/images 206 and 213). No central pulmonary emboli. Atherosclerotic nonaneurysmal thoracic aorta. Normal caliber pulmonary arteries. Normal heart size. No significant pericardial fluid/thickening. Mediastinum/Nodes: Hypodense 2.0 cm anterior left thyroid nodule. Unremarkable esophagus. No pathologically enlarged axillary, mediastinal or hilar lymph nodes. Lungs/Pleura: No pneumothorax. No pleural effusion. Extensive patchy ground-glass opacity throughout both lungs, most prominent in the peripheral perilobular lungs. No lung  masses or significant pulmonary nodules. Mild centrilobular emphysema. Upper abdomen: No acute abnormality. Musculoskeletal: No aggressive appearing focal osseous lesions. Moderate thoracic spondylosis. Chronic moderate T12 vertebral compression fracture. Review of the MIP images confirms the above findings. IMPRESSION: 1. Acute subsegmental bilateral lower lobe pulmonary emboli. No central pulmonary emboli. 2. Extensive patchy ground-glass opacity throughout both lungs, most prominent in the peripheral perilobular lungs, compatible with COVID-19 pneumonia. 3. Aortic Atherosclerosis (ICD10-I70.0) and Emphysema (ICD10-J43.9). Electronically Signed: By: Ilona Sorrel M.D. On: 05/28/2020 18:56   DG Chest Port 1 View  Result Date: 05/22/2020 CLINICAL DATA:  Short of breath, COVID-19 EXAM: PORTABLE CHEST 1 VIEW COMPARISON:  None. FINDINGS: 2 frontal views of the chest demonstrate an unremarkable cardiac silhouette. There is diffuse interstitial prominence, with moderate multifocal bilateral ground-glass airspace disease greatest at the lung bases. No effusion or pneumothorax. IMPRESSION: 1. Moderate bilateral multifocal pneumonia, compatible with COVID-19. Electronically Signed   By: Randa Ngo M.D.   On: 05/22/2020 22:31   VAS [place] LOWER EXTREMITY VENOUS (DVT)  Result Date: 05/27/2020  Lower Venous DVT Study Indications: Elevated Ddimer.  Risk Factors: COVID 19 positive. Comparison Study: No prior studies. Performing Technologist: Oliver Hum RVT  Examination Guidelines: A complete evaluation includes B-mode imaging, spectral Doppler, color Doppler, and power Doppler as needed of all  accessible portions of each vessel. Bilateral testing is considered an integral part of a complete examination. Limited examinations for reoccurring indications may be performed as noted. The reflux portion of the exam is performed with the patient in reverse Trendelenburg.   +---------+---------------+---------+-----------+----------+--------------+ RIGHT    CompressibilityPhasicitySpontaneityPropertiesThrombus Aging +---------+---------------+---------+-----------+----------+--------------+ CFV      Full           Yes      Yes                                 +---------+---------------+---------+-----------+----------+--------------+ SFJ      Full                                                        +---------+---------------+---------+-----------+----------+--------------+ FV Prox  Full                                                        +---------+---------------+---------+-----------+----------+--------------+ FV Mid   Full                                                        +---------+---------------+---------+-----------+----------+--------------+ FV DistalFull                                                        +---------+---------------+---------+-----------+----------+--------------+ PFV      Full                                                        +---------+---------------+---------+-----------+----------+--------------+ POP      Full           Yes      Yes                                 +---------+---------------+---------+-----------+----------+--------------+ PTV      Full                                                        +---------+---------------+---------+-----------+----------+--------------+ PERO     Full                                                        +---------+---------------+---------+-----------+----------+--------------+   +---------+---------------+---------+-----------+----------+--------------+ LEFT  CompressibilityPhasicitySpontaneityPropertiesThrombus Aging +---------+---------------+---------+-----------+----------+--------------+ CFV      Full           Yes      Yes                                  +---------+---------------+---------+-----------+----------+--------------+ SFJ      Full                                                        +---------+---------------+---------+-----------+----------+--------------+ FV Prox  Full                                                        +---------+---------------+---------+-----------+----------+--------------+ FV Mid   Full                                                        +---------+---------------+---------+-----------+----------+--------------+ FV DistalFull                                                        +---------+---------------+---------+-----------+----------+--------------+ PFV      Full                                                        +---------+---------------+---------+-----------+----------+--------------+ POP      Full           Yes      Yes                                 +---------+---------------+---------+-----------+----------+--------------+ PTV      Full                                                        +---------+---------------+---------+-----------+----------+--------------+ PERO     Full                                                        +---------+---------------+---------+-----------+----------+--------------+     Summary: RIGHT: - There is no evidence of deep vein thrombosis in the lower extremity.  - No cystic structure found in the popliteal fossa.  LEFT: - There is no evidence of deep vein thrombosis in the lower extremity.  - No cystic structure found in the  popliteal fossa.  *See table(s) above for measurements and observations. Electronically signed by Curt Jews MD on 05/27/2020 at 12:58:19 PM.    Final      Time coordinating discharge: Over 38 minutes    Dwyane Dee, MD  [facility] 05/30/2020, 2:38 PM

## 2020-06-06 ENCOUNTER — Other Ambulatory Visit: Payer: Self-pay

## 2020-06-06 ENCOUNTER — Ambulatory Visit (INDEPENDENT_AMBULATORY_CARE_PROVIDER_SITE_OTHER): Payer: 59 | Admitting: Nurse Practitioner

## 2020-06-06 ENCOUNTER — Encounter: Payer: Self-pay | Admitting: Nurse Practitioner

## 2020-06-06 VITALS — BP 118/62 | HR 94 | Temp 98.0°F | Ht 67.0 in | Wt 220.6 lb

## 2020-06-06 DIAGNOSIS — U099 Post covid-19 condition, unspecified: Secondary | ICD-10-CM | POA: Diagnosis not present

## 2020-06-06 DIAGNOSIS — R0609 Other forms of dyspnea: Secondary | ICD-10-CM

## 2020-06-06 DIAGNOSIS — R0600 Dyspnea, unspecified: Secondary | ICD-10-CM | POA: Diagnosis not present

## 2020-06-06 NOTE — Patient Instructions (Signed)
Shortness of Breath, Adult Shortness of breath means you have trouble breathing. Shortness of breath could be a sign of a medical problem. Follow these instructions at home:  Watch for any changes in your symptoms.  Do not use any products that contain nicotine or tobacco, such as cigarettes, e-cigarettes, and chewing tobacco.  Do not smoke. Smoking can cause shortness of breath. If you need help to quit smoking, ask your doctor.  Avoid things that can make it harder to breathe, such as: ? Mold. ? Dust. ? Air pollution. ? Chemical smells. ? Things that can cause allergy symptoms (allergens), if you have allergies.  Keep your living space clean. Use products that help remove mold and dust.  Rest as needed. Slowly return to your normal activities.  Take over-the-counter and prescription medicines only as told by your doctor. This includes oxygen therapy and inhaled medicines.  Keep all follow-up visits as told by your doctor. This is important.   Contact a doctor if:  Your condition does not get better as soon as expected.  You have a hard time doing your normal activities, even after you rest.  You have new symptoms. Get help right away if:  Your shortness of breath gets worse.  You have trouble breathing when you are resting.  You feel light-headed or you pass out (faint).  You have a cough that is not helped by medicines.  You cough up blood.  You have pain with breathing.  You have pain in your chest, arms, shoulders, or belly (abdomen).  You have a fever.  You cannot walk up stairs.  You cannot exercise the way you normally do. These symptoms may represent a serious problem that is an emergency. Do not wait to see if the symptoms will go away. Get medical help right away. Call your local emergency services (911 in the [place] Do not drive yourself to the hospital. Summary  Shortness of breath is when you have trouble breathing enough air. It can be a sign of a  medical problem.  Avoid things that make it hard for you to breathe, such as smoking, pollution, mold, and dust.  Watch for any changes in your symptoms. Contact your doctor if you do not get better or you get worse. This information is not intended to replace advice given to you by your health care provider. Make sure you discuss any questions you have with your health care provider. Document Revised: 08/29/2017 Document Reviewed: 08/29/2017 Elsevier Patient Education  2021 Elsevier Inc.  

## 2020-06-06 NOTE — Progress Notes (Signed)
   Subjective:    Patient ID: Jeffrey Abbott, male    DOB: 25-Sep-1960, 60 y.o.   MRN: [id number]   Chief Complaint: Hospitalization Follow-up (Post covid)   HPI Patient comes in today for hospital follow up. He was in the hospital with covid pneumonia. He was in there for 8 days. Did not have to be put on ventilator. Was sent home on oxygen his o2 sat at home runs usually in the 90's with oxygen on. Drops in the 80's when he gets up and moves around without his oxygen. Still having dyspnea, but is feeling much better.   Review of Systems  Constitutional: Negative for diaphoresis.  Eyes: Negative for pain.  Respiratory: Positive for cough (slight) and shortness of breath. Negative for chest tightness.   Cardiovascular: Negative for chest pain, palpitations and leg swelling.  Gastrointestinal: Negative for abdominal pain.  Endocrine: Negative for polydipsia.  Skin: Negative for rash.  Neurological: Negative for dizziness, weakness and headaches.  Hematological: Does not bruise/bleed easily.  All other systems reviewed and are negative.      Objective:   Physical Exam Vitals and nursing note reviewed.  Constitutional:      Appearance: Normal appearance.  Cardiovascular:     Rate and Rhythm: Normal rate and regular rhythm.     Heart sounds: Normal heart sounds.  Pulmonary:     Effort: Pulmonary effort is normal.     Breath sounds: Normal breath sounds.  Skin:    General: Skin is warm and dry.  Neurological:     General: No focal deficit present.     Mental Status: He is alert.  Psychiatric:        Mood and Affect: Mood normal.        Behavior: Behavior normal.    BP 118/62   Pulse 94   Temp 98 F (36.7 C)   Ht 5\' 7"  (1.702 m)   Wt 220 lb 9.6 oz (100.1 kg)   SpO2 98% Comment: 4 L o2  BMI 34.55 kg/m         Assessment & Plan:  Jeffrey Abbott in today with chief complaint of Hospitalization Follow-up (Post covid)   1. Dyspnea on exertion  2. 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 Follow  Up in 2 weeks.    The above assessment and management plan was discussed with the patient. The patient verbalized understanding of and has agreed to the management plan. Patient is aware to call the clinic if symptoms persist or worsen. Patient is aware when to return to the clinic for a follow-up visit. Patient educated on when it is appropriate to go to the emergency department.   Mary-Margaret Hassell Done, FNP

## 2020-06-20 ENCOUNTER — Encounter: Payer: Self-pay | Admitting: Nurse Practitioner

## 2020-06-20 ENCOUNTER — Ambulatory Visit (INDEPENDENT_AMBULATORY_CARE_PROVIDER_SITE_OTHER): Payer: 59 | Admitting: Nurse Practitioner

## 2020-06-20 ENCOUNTER — Other Ambulatory Visit: Payer: Self-pay

## 2020-06-20 VITALS — BP 135/76 | HR 85 | Temp 99.8°F | Resp 20 | Ht 67.0 in | Wt 231.0 lb

## 2020-06-20 DIAGNOSIS — R0609 Other forms of dyspnea: Secondary | ICD-10-CM | POA: Insufficient documentation

## 2020-06-20 DIAGNOSIS — U099 Post covid-19 condition, unspecified: Secondary | ICD-10-CM | POA: Diagnosis not present

## 2020-06-20 NOTE — Patient Instructions (Signed)
Shortness of Breath, Adult Shortness of breath means you have trouble breathing. Shortness of breath could be a sign of a medical problem. Follow these instructions at home:  Watch for any changes in your symptoms.  Do not use any products that contain nicotine or tobacco, such as cigarettes, e-cigarettes, and chewing tobacco.  Do not smoke. Smoking can cause shortness of breath. If you need help to quit smoking, ask your doctor.  Avoid things that can make it harder to breathe, such as: ? Mold. ? Dust. ? Air pollution. ? Chemical smells. ? Things that can cause allergy symptoms (allergens), if you have allergies.  Keep your living space clean. Use products that help remove mold and dust.  Rest as needed. Slowly return to your normal activities.  Take over-the-counter and prescription medicines only as told by your doctor. This includes oxygen therapy and inhaled medicines.  Keep all follow-up visits as told by your doctor. This is important.   Contact a doctor if:  Your condition does not get better as soon as expected.  You have a hard time doing your normal activities, even after you rest.  You have new symptoms. Get help right away if:  Your shortness of breath gets worse.  You have trouble breathing when you are resting.  You feel light-headed or you pass out (faint).  You have a cough that is not helped by medicines.  You cough up blood.  You have pain with breathing.  You have pain in your chest, arms, shoulders, or belly (abdomen).  You have a fever.  You cannot walk up stairs.  You cannot exercise the way you normally do. These symptoms may represent a serious problem that is an emergency. Do not wait to see if the symptoms will go away. Get medical help right away. Call your local emergency services (911 in the [place] Do not drive yourself to the hospital. Summary  Shortness of breath is when you have trouble breathing enough air. It can be a sign of a  medical problem.  Avoid things that make it hard for you to breathe, such as smoking, pollution, mold, and dust.  Watch for any changes in your symptoms. Contact your doctor if you do not get better or you get worse. This information is not intended to replace advice given to you by your health care provider. Make sure you discuss any questions you have with your health care provider. Document Revised: 08/29/2017 Document Reviewed: 08/29/2017 Elsevier Patient Education  2021 Elsevier Inc.  

## 2020-06-20 NOTE — Progress Notes (Signed)
   Subjective:    Patient ID: Jeffrey Abbott, male    DOB: 1960/05/04, 60 y.o.   MRN: [id number]   Chief Complaint: Follow up dyspnea   HPI Patient comes in today with dyspnea. He was seen on 06/06/20 with the same complaint. He had covid on 3 weeks prior. He has been able to go without his oxygen. He has been going outside and working for several hours without oxygen. When he comes in and checks.pulse ox it is in the mid 90'2 with hert rate in low 100's. He says he feels short of breath when he first get sup but goes away quickly.   Review of Systems  Constitutional: Negative for diaphoresis.  Eyes: Negative for pain.  Respiratory: Negative for shortness of breath.   Cardiovascular: Negative for chest pain, palpitations and leg swelling.  Gastrointestinal: Negative for abdominal pain.  Endocrine: Negative for polydipsia.  Skin: Negative for rash.  Neurological: Negative for dizziness, weakness and headaches.  Hematological: Does not bruise/bleed easily.  All other systems reviewed and are negative.      Objective:   Physical Exam Vitals and nursing note reviewed.  Constitutional:      Appearance: Normal appearance. He is well-developed.  Neck:     Thyroid: No thyroid mass or thyromegaly.     Vascular: No carotid bruit or JVD.     Trachea: Phonation normal.  Cardiovascular:     Rate and Rhythm: Normal rate and regular rhythm.  Pulmonary:     Effort: Pulmonary effort is normal. No respiratory distress.     Breath sounds: Normal breath sounds.  Abdominal:     Tenderness: There is no abdominal tenderness.  Musculoskeletal:        General: Normal range of motion.     Cervical back: Normal range of motion and neck supple.  Lymphadenopathy:     Cervical: No cervical adenopathy.  Skin:    General: Skin is warm and dry.  Neurological:     Mental Status: He is alert and oriented to person, place, and time.  Psychiatric:        Behavior: Behavior normal.        Thought  Content: Thought content normal.        Judgment: Judgment normal.      BP 135/76   Pulse 85   Temp 99.8 F (37.7 C) (Temporal)   Resp 20   Ht 5\' 7"  (1.702 m)   Wt 231 lb (104.8 kg)   SpO2 96%   BMI 36.18 kg/m       Assessment & Plan:  Jeffrey Abbott in today with chief complaint of Follow up dyspnea   1. Post-COVID chronic dyspnea Keep oxygen sat monitor with him at work to check frequently Oxygen in office at work incase needed for now.  RTO prn    The above assessment and management plan was discussed with the patient. The patient verbalized understanding of and has agreed to the management plan. Patient is aware to call the clinic if symptoms persist or worsen. Patient is aware when to return to the clinic for a follow-up visit. Patient educated on when it is appropriate to go to the emergency department.   Mary-Margaret Hassell Done, FNP

## 2020-12-16 ENCOUNTER — Ambulatory Visit (INDEPENDENT_AMBULATORY_CARE_PROVIDER_SITE_OTHER): Payer: 59 | Admitting: Nurse Practitioner

## 2020-12-16 ENCOUNTER — Encounter: Payer: Self-pay | Admitting: Nurse Practitioner

## 2020-12-16 ENCOUNTER — Other Ambulatory Visit: Payer: Self-pay

## 2020-12-16 VITALS — BP 133/78 | HR 74 | Temp 98.4°F | Resp 20 | Ht 67.0 in | Wt 239.0 lb

## 2020-12-16 DIAGNOSIS — H6123 Impacted cerumen, bilateral: Secondary | ICD-10-CM | POA: Diagnosis not present

## 2020-12-16 DIAGNOSIS — Z86010 Personal history of colonic polyps: Secondary | ICD-10-CM

## 2020-12-16 DIAGNOSIS — L918 Other hypertrophic disorders of the skin: Secondary | ICD-10-CM

## 2020-12-16 NOTE — Patient Instructions (Signed)
Earwax Buildup, Adult ?The ears produce a substance called earwax that helps keep bacteria out of the ear and protects the skin in the ear canal. Occasionally, earwax can build up in the ear and cause discomfort or hearing loss. ?What are the causes? ?This condition is caused by a buildup of earwax. Ear canals are self-cleaning. Ear wax is made in the outer part of the ear canal and generally falls out in small amounts over time. ?When the self-cleaning mechanism is not working, earwax builds up and can cause decreased hearing and discomfort. Attempting to clean ears with cotton swabs can push the earwax deep into the ear canal and cause decreased hearing and pain. ?What increases the risk? ?This condition is more likely to develop in people who: ?Clean their ears often with cotton swabs. ?Pick at their ears. ?Use earplugs or in-ear headphones often, or wear hearing aids. ?The following factors may also make you more likely to develop this condition: ?Being male. ?Being of older age. ?Naturally producing more earwax. ?Having narrow ear canals. ?Having earwax that is overly thick or sticky. ?Having excess hair in the ear canal. ?Having eczema. ?Being dehydrated. ?What are the signs or symptoms? ?Symptoms of this condition include: ?Reduced or muffled hearing. ?A feeling of fullness in the ear or feeling that the ear is plugged. ?Fluid coming from the ear. ?Ear pain or an itchy ear. ?Ringing in the ear. ?Coughing. ?Balance problems. ?An obvious piece of earwax that can be seen inside the ear canal. ?How is this diagnosed? ?This condition may be diagnosed based on: ?Your symptoms. ?Your medical history. ?An ear exam. During the exam, your health care provider will look into your ear with an instrument called an otoscope. ?You may have tests, including a hearing test. ?How is this treated? ?This condition may be treated by: ?Using ear drops to soften the earwax. ?Having the earwax removed by a health care provider. The  health care provider may: ?Flush the ear with water. ?Use an instrument that has a loop on the end (curette). ?Use a suction device. ?Having surgery to remove the wax buildup. This may be done in severe cases. ?Follow these instructions at home: ? ?Take over-the-counter and prescription medicines only as told by your health care provider. ?Do not put any objects, including cotton swabs, into your ear. You can clean the opening of your ear canal with a washcloth or facial tissue. ?Follow instructions from your health care provider about cleaning your ears. Do not overclean your ears. ?Drink enough fluid to keep your urine pale yellow. This will help to thin the earwax. ?Keep all follow-up visits as told. If earwax builds up in your ears often or if you use hearing aids, consider seeing your health care provider for routine, preventive ear cleanings. Ask your health care provider how often you should schedule your cleanings. ?If you have hearing aids, clean them according to instructions from the manufacturer and your health care provider. ?Contact a health care provider if: ?You have ear pain. ?You develop a fever. ?You have pus or other fluid coming from your ear. ?You have hearing loss. ?You have ringing in your ears that does not go away. ?You feel like the room is spinning (vertigo). ?Your symptoms do not improve with treatment. ?Get help right away if: ?You have bleeding from the affected ear. ?You have severe ear pain. ?Summary ?Earwax can build up in the ear and cause discomfort or hearing loss. ?The most common symptoms of this condition include   reduced or muffled hearing, a feeling of fullness in the ear, or feeling that the ear is plugged. ?This condition may be diagnosed based on your symptoms, your medical history, and an ear exam. ?This condition may be treated by using ear drops to soften the earwax or by having the earwax removed by a health care provider. ?Do not put any objects, including cotton  swabs, into your ear. You can clean the opening of your ear canal with a washcloth or facial tissue. ?This information is not intended to replace advice given to you by your health care provider. Make sure you discuss any questions you have with your health care provider. ?Document Revised: 07/17/2019 Document Reviewed: 07/17/2019 ?Elsevier Patient Education ? 2022 Elsevier Inc. ? ?

## 2020-12-16 NOTE — Progress Notes (Signed)
   Subjective:    Patient ID: Jeffrey Abbott, male    DOB: 24-Sep-1960, 60 y.o.   MRN: [id number]    Chief Complaint: Wants ears checked and skin tags under arms   HPI Patient come sin with 2 complaints: - wants his ears checked. He has a history of cerumen impaction and he has been having some trouble with his hearing the last few days. - skin tags in axilla that wants removed.   Review of Systems  Constitutional:  Negative for diaphoresis.  Eyes:  Negative for pain.  Respiratory:  Negative for shortness of breath.   Cardiovascular:  Negative for chest pain, palpitations and leg swelling.  Gastrointestinal:  Negative for abdominal pain.  Endocrine: Negative for polydipsia.  Skin:  Negative for rash.  Neurological:  Negative for dizziness, weakness and headaches.  Hematological:  Does not bruise/bleed easily.  All other systems reviewed and are negative.     Objective:   Physical Exam Vitals and nursing note reviewed.  Constitutional:      Appearance: Normal appearance. He is obese.  HENT:     Right Ear: Tympanic membrane normal. There is impacted cerumen.     Left Ear: Tympanic membrane normal. There is impacted cerumen.  Neurological:     Mental Status: He is alert.   Remove of 4 skin tags right axillia and 7 skin tags left axillia  Bil ear irrigation- S/P TMs clear bil  BP 133/78   Pulse 74   Temp 98.4 F (36.9 C) (Temporal)   Resp 20   Ht '5\' 7"'$  (1.702 m)   Wt 239 lb (108.4 kg)   SpO2 99%   BMI 37.43 kg/m          Assessment & Plan:  [name] in today with chief complaint of Wants ears checked and skin tags under arms   1. Hx of colonic polyp Referral made - Ambulatory referral to Gastroenterology  2. Bilateral impacted cerumen Debrox 2-3 x a week  3. Acquired skin tag Keep areas clean and dry 'watch for signs of infection    The above assessment and management plan was discussed with the patient. The patient verbalized  understanding of and has agreed to the management plan. Patient is aware to call the clinic if symptoms persist or worsen. Patient is aware when to return to the clinic for a follow-up visit. Patient educated on when it is appropriate to go to the emergency department.   Mary-Margaret Hassell Done, FNP

## 2021-01-08 ENCOUNTER — Encounter: Payer: Self-pay | Admitting: Internal Medicine

## 2021-02-20 ENCOUNTER — Encounter: Payer: Self-pay | Admitting: Internal Medicine

## 2021-02-20 ENCOUNTER — Ambulatory Visit (AMBULATORY_SURGERY_CENTER): Payer: 59 | Admitting: *Deleted

## 2021-02-20 ENCOUNTER — Other Ambulatory Visit: Payer: Self-pay

## 2021-02-20 VITALS — Ht 67.0 in | Wt 233.0 lb

## 2021-02-20 DIAGNOSIS — Z86010 Personal history of colonic polyps: Secondary | ICD-10-CM

## 2021-02-20 MED ORDER — NA SULFATE-K SULFATE-MG SULF 17.5-3.13-1.6 GM/177ML PO SOLN: 1.0000 | Freq: Once | ORAL | 0 refills | Status: AC

## 2021-02-20 NOTE — Progress Notes (Signed)
No egg or soy allergy known to patient  No issues known to pt with past sedation with any surgeries or procedures but last colon was hard to wake up post op with MAC Patient denies ever being told they had issues or difficulty with intubation  No FH of Malignant Hyperthermia Pt is not on diet pills Pt is not on  home 02  Pt is not on blood thinners  Pt denies issues with constipation  No A fib or A flutter  Pt is not vaccinated  for Covid    NO PA's for preps discussed with pt In PV today  Discussed with pt there will be an out-of-pocket cost for prep and that varies from $0 to 70 +  dollars - pt verbalized understanding   Due to the COVID-19 pandemic we are asking patients to follow certain guidelines in PV and the [place]   Pt aware of COVID protocols and [facility] guidelines   PV completed over the phone. Pt verified name, DOB, address and insurance during PV today.  Pt mailed instruction packet with copy of consent form to read and not return, and instructions.  Pt encouraged to call with questions or issues.  If pt has My chart, procedure instructions sent via My Chart

## 2021-02-25 ENCOUNTER — Encounter: Payer: Self-pay | Admitting: Nurse Practitioner

## 2021-02-25 ENCOUNTER — Ambulatory Visit (INDEPENDENT_AMBULATORY_CARE_PROVIDER_SITE_OTHER): Payer: 59 | Admitting: Nurse Practitioner

## 2021-02-25 DIAGNOSIS — J101 Influenza due to other identified influenza virus with other respiratory manifestations: Secondary | ICD-10-CM | POA: Diagnosis not present

## 2021-02-25 DIAGNOSIS — J069 Acute upper respiratory infection, unspecified: Secondary | ICD-10-CM | POA: Diagnosis not present

## 2021-02-25 MED ORDER — PSEUDOEPH-BROMPHEN-DM 30-2-10 MG/5ML PO SYRP: 5.0000 mL | ORAL_SOLUTION | Freq: Four times a day (QID) | ORAL | 0 refills | Status: DC | PRN

## 2021-02-25 MED ORDER — OSELTAMIVIR PHOSPHATE 75 MG PO CAPS: 75.0000 mg | ORAL_CAPSULE | Freq: Two times a day (BID) | ORAL | 0 refills | Status: DC

## 2021-02-25 NOTE — Progress Notes (Signed)
   Virtual Visit  Note Due to COVID-19 pandemic this visit was conducted virtually. This visit type was conducted due to national recommendations for restrictions regarding the COVID-19 Pandemic (e.g. social distancing, sheltering in place) in an effort to limit this patient's exposure and mitigate transmission in our community. All issues noted in this document were discussed and addressed.  A physical exam was not performed with this format.  I connected with Jeffrey Abbott on 02/25/21 at 10:40 AM by telephone and verified that I am speaking with the correct person using two identifiers. Jeffrey Abbott is currently located at home during visit. The provider, Ivy Lynn, NP is located in their office at time of visit.  I discussed the limitations, risks, security and privacy concerns of performing an evaluation and management service by telephone and the availability of in person appointments. I also discussed with the patient that there may be a patient responsible charge related to this service. The patient expressed understanding and agreed to proceed.   History and Present Illness:  URI  This is a recurrent problem. The current episode started in the past 7 days. The problem has been gradually worsening. There has been no fever. Associated symptoms include congestion and coughing. Pertinent negatives include no headaches, nausea, rash, sinus pain or sore throat. He has tried nothing for the symptoms.     Review of Systems  Constitutional:  Positive for fever. Negative for chills.  HENT:  Positive for congestion. Negative for sinus pain and sore throat.   Respiratory:  Positive for cough.   Gastrointestinal:  Negative for nausea.  Skin:  Negative for rash.  Neurological:  Negative for headaches.  All other systems reviewed and are negative.   Observations/Objective: Televisit patient not in distress  Assessment and Plan: Take meds as prescribed - Use a cool mist humidifier   -Use saline nose sprays frequently -Force fluids -For fever or aches or pains- take Tylenol or ibuprofen. -Flu swab results pending. -Bromfed for cough, cold symptoms and rhinitis. -If symptoms do not improve, she may need to be COVID tested to rule this out Follow up with worsening unresolved symptoms   Follow Up Instructions: Follow-up with worsening unresolved symptoms    I discussed the assessment and treatment plan with the patient. The patient was provided an opportunity to ask questions and all were answered. The patient agreed with the plan and demonstrated an understanding of the instructions.   The patient was advised to call back or seek an in-person evaluation if the symptoms worsen or if the condition fails to improve as anticipated.  The above assessment and management plan was discussed with the patient. The patient verbalized understanding of and has agreed to the management plan. Patient is aware to call the clinic if symptoms persist or worsen. Patient is aware when to return to the clinic for a follow-up visit. Patient educated on when it is appropriate to go to the emergency department.   Time call ended: 10:47 AM  I provided  7 minutes of  non face-to-face time during this encounter.    Ivy Lynn, NP

## 2021-02-25 NOTE — Assessment & Plan Note (Signed)
Take meds as prescribed - Use a cool mist humidifier  -Use saline nose sprays frequently -Force fluids -For fever or aches or pains- take Tylenol or ibuprofen. -Flu swab results pending. -Bromfed for cough, cold symptoms and rhinitis. -If symptoms do not improve, she may need to be COVID tested to rule this out Follow up with worsening unresolved symptoms

## 2021-02-25 NOTE — Patient Instructions (Signed)

## 2021-02-25 NOTE — Addendum Note (Signed)
Addended by: Ivy Lynn on: 02/25/2021 05:09 PM   Modules accepted: Orders

## 2021-02-26 LAB — VERITOR FLU A/B WAIVED
Influenza A: POSITIVE — AB
Influenza B: NEGATIVE

## 2021-02-27 ENCOUNTER — Telehealth: Payer: Self-pay | Admitting: Nurse Practitioner

## 2021-02-27 NOTE — Telephone Encounter (Signed)
Patient aware and verbalized understanding. °

## 2021-03-13 ENCOUNTER — Ambulatory Visit (AMBULATORY_SURGERY_CENTER): Payer: 59 | Admitting: Internal Medicine

## 2021-03-13 ENCOUNTER — Other Ambulatory Visit: Payer: Self-pay

## 2021-03-13 ENCOUNTER — Encounter: Payer: Self-pay | Admitting: Internal Medicine

## 2021-03-13 VITALS — BP 138/77 | HR 73 | Temp 98.4°F | Resp 23 | Ht 67.0 in | Wt 233.0 lb

## 2021-03-13 DIAGNOSIS — D125 Benign neoplasm of sigmoid colon: Secondary | ICD-10-CM

## 2021-03-13 DIAGNOSIS — D122 Benign neoplasm of ascending colon: Secondary | ICD-10-CM

## 2021-03-13 DIAGNOSIS — K635 Polyp of colon: Secondary | ICD-10-CM | POA: Diagnosis not present

## 2021-03-13 DIAGNOSIS — Z86010 Personal history of colonic polyps: Secondary | ICD-10-CM

## 2021-03-13 MED ORDER — SODIUM CHLORIDE 0.9 % IV SOLN: 500.0000 mL | Freq: Once | INTRAVENOUS | Status: DC

## 2021-03-13 NOTE — Progress Notes (Signed)
Called to room to assist during endoscopic procedure.  Patient ID and intended procedure confirmed with present staff. Received instructions for my participation in the procedure from the performing physician.  

## 2021-03-13 NOTE — Progress Notes (Signed)
GASTROENTEROLOGY PROCEDURE H&P NOTE   Primary Care Physician: Chevis Pretty, FNP    Reason for Procedure:  History of multiple colon polyps  Plan:    Surveillance colonoscopy  Patient is appropriate for endoscopic procedure(s) in the ambulatory ([facility]) setting.  The nature of the procedure, as well as the risks, benefits, and alternatives were carefully and thoroughly reviewed with the patient. Ample time for discussion and questions allowed. The patient understood, was satisfied, and agreed to proceed.     HPI: Jeffrey Abbott is a 60 y.o. male who presents for colonoscopy for polyp surveillance.  Medical history as below.  He is now off anticoagulation after having bilateral DVT associated with COVID earlier in the year.  Tolerated the prep.  No trouble with recent chest pain or shortness of breath.  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   Current Facility-Administered Medications  Medication Dose Route Frequency Provider Last Rate Last Admin   0.9 %  sodium chloride infusion  500 mL Intravenous Once Nevae Pinnix, Lajuan Lines, MD        Allergies as of 03/13/2021 - Review Complete 03/13/2021  Allergen Reaction Noted   Niaspan [niacin er] Other (See Comments) 04/13/2016    Family History  Problem Relation Age of Onset   Colon polyps Mother    Asthma Mother    Hypertension Father     Peripheral vascular disease Father 70   Coronary artery disease Father 12   Colon cancer Maternal Aunt    Liver cancer Maternal Aunt    Colon cancer Maternal Grandmother        possibly started in colon, traveled to stomach   Stomach cancer Maternal Grandmother    Esophageal cancer Neg Hx    Rectal cancer Neg Hx     Social History   Socioeconomic History   Marital status: Single    Spouse name: Not on file   Number of children: 1   Years of education: Not on file   Highest education level: Not on file  Occupational History   Not on file  Tobacco Use   Smoking status: Former    Packs/day: 1.00    Years: 25.00    Pack years: 25.00    Types: Cigarettes   Smokeless tobacco: Current    Types: Chew   Tobacco comments:    quit smoking March 2015  Substance and Sexual Activity   Alcohol use: Not Currently   Drug use: No   Sexual activity: Not on file  Other Topics Concern   Not on file  Social History Narrative   Lives alone.   Social Determinants of Health   Financial Resource Strain: Not on file  Food Insecurity: Not on file  Transportation Needs: Not on file  Physical Activity: Not on file  Stress: Not on file  Social Connections: Not on file  Intimate Partner Violence: Not on file    Physical Exam: Vital signs in last 24 hours: @BP  135/77   Pulse 69   Temp 98.4 F (36.9 C) (Skin)   Ht 5\' 7"  (1.702 m)   Wt 233 lb (105.7 kg)   SpO2 97%   BMI 36.49 kg/m  GEN: NAD EYE: Sclerae anicteric ENT: MMM CV: Non-tachycardic Pulm: CTA b/l GI: Soft, NT/ND NEURO:  Alert & Oriented x 3   Zenovia Jarred, MD [facility]  03/13/2021 8:30 AM

## 2021-03-13 NOTE — Progress Notes (Signed)
Pt's states no medical or surgical changes since previsit or office visit. VS assessed by C.W 

## 2021-03-13 NOTE — Op Note (Signed)
[place] Patient Name: Jeffrey Abbott Procedure Date: 03/13/2021 8:36 AM MRN: [id number] Endoscopist: Jerene Bears , MD Age: 60 Referring MD:  Date of Birth: 01-29-1961 Gender: Male Account #: [id number] Procedure:                Colonoscopy Indications:              High risk colon cancer surveillance: Personal                            history of multiple adenomas (including those 10 mm                            or greater in size), 1st and last colonoscopy to                            date: January 2018 Medicines:                Monitored Anesthesia Care Procedure:                Pre-Anesthesia Assessment:                           - Prior to the procedure, a History and Physical                            was performed, and patient medications and                            allergies were reviewed. The patient's tolerance of                            previous anesthesia was also reviewed. The risks                            and benefits of the procedure and the sedation                            options and risks were discussed with the patient.                            All questions were answered, and informed consent                            was obtained. Prior Anticoagulants: The patient has                            taken no previous anticoagulant or antiplatelet                            agents. ASA Grade Assessment: III - A patient with                            severe systemic disease. After reviewing the risks  and benefits, the patient was deemed in                            satisfactory condition to undergo the procedure.                           After obtaining informed consent, the colonoscope                            was passed under direct vision. Throughout the                            procedure, the patient's blood pressure, pulse, and                            oxygen saturations were monitored continuously.  The                            CF [id number] #[id number] was introduced through the anus                            and advanced to the cecum, identified by                            appendiceal orifice and ileocecal valve. The                            colonoscopy was performed without difficulty. The                            patient tolerated the procedure well. The quality                            of the bowel preparation was good. The ileocecal                            valve, appendiceal orifice, and rectum were                            photographed. Scope In: 8:40:59 AM Scope Out: 9:01:13 AM Scope Withdrawal Time: 0 hours 17 minutes 55 seconds  Total Procedure Duration: 0 hours 20 minutes 14 seconds  Findings:                 The digital rectal exam was normal.                           Five sessile polyps were found in the ascending                            colon. The polyps were 4 to 8 mm in size. These                            polyps were removed with a cold snare. Resection  and retrieval were complete.                           A 4 mm polyp was found in the sigmoid colon. The                            polyp was sessile. The polyp was removed with a                            cold snare. Resection and retrieval were complete.                           Multiple small and large-mouthed diverticula were                            found in the sigmoid colon, descending colon and                            ascending colon.                           Internal hemorrhoids were found during                            retroflexion. The hemorrhoids were small. Complications:            No immediate complications. Estimated Blood Loss:     Estimated blood loss was minimal. Impression:               - Five 4 to 8 mm polyps in the ascending colon,                            removed with a cold snare. Resected and retrieved.                           - One 4  mm polyp in the sigmoid colon, removed with                            a cold snare. Resected and retrieved.                           - Diverticulosis in the sigmoid colon, in the                            descending colon and in the ascending colon.                           - Internal hemorrhoids. Recommendation:           - Patient has a contact number available for                            emergencies. The signs and symptoms of potential  delayed complications were discussed with the                            patient. Return to normal activities tomorrow.                            Written discharge instructions were provided to the                            patient.                           - Resume previous diet.                           - Continue present medications.                           - Await pathology results.                           - Repeat colonoscopy date to be determined after                            pending pathology results are reviewed for                            surveillance. Jerene Bears, MD 03/13/2021 9:05:09 AM This report has been signed electronically.

## 2021-03-13 NOTE — Patient Instructions (Signed)
YOU HAD AN ENDOSCOPIC PROCEDURE TODAY AT THE [facility]:   Refer to the procedure report that was given to you for any specific questions about what was found during the examination.  If the procedure report does not answer your questions, please call your gastroenterologist to clarify.  If you requested that your care partner not be given the details of your procedure findings, then the procedure report has been included in a sealed envelope for you to review at your convenience later.  YOU SHOULD EXPECT: Some feelings of bloating in the abdomen. Passage of more gas than usual.  Walking can help get rid of the air that was put into your GI tract during the procedure and reduce the bloating. If you had a lower endoscopy (such as a colonoscopy or flexible sigmoidoscopy) you may notice spotting of blood in your stool or on the toilet paper. If you underwent a bowel prep for your procedure, you may not have a normal bowel movement for a few days.  Please Note:  You might notice some irritation and congestion in your nose or some drainage.  This is from the oxygen used during your procedure.  There is no need for concern and it should clear up in a day or so.  SYMPTOMS TO REPORT IMMEDIATELY:  Following lower endoscopy (colonoscopy or flexible sigmoidoscopy):  Excessive amounts of blood in the stool  Significant tenderness or worsening of abdominal pains  Swelling of the abdomen that is new, acute  Fever of 100F or higher   For urgent or emergent issues, a gastroenterologist can be reached at any hour by calling [phone]. Do not use MyChart messaging for urgent concerns.    DIET:  We do recommend a small meal at first, but then you may proceed to your regular diet.  Drink plenty of fluids but you should avoid alcoholic beverages for 24 hours.  MEDICATIONS:  Continue present medications.  Please see handouts given to you by your recovery nurse.  Thank you for allowing us to  provide for your healthcare needs today.  ACTIVITY:  You should plan to take it easy for the rest of today and you should NOT DRIVE or use heavy machinery until tomorrow (because of the sedation medicines used during the test).    FOLLOW UP: Our staff will call the number listed on your records 48-72 hours following your procedure to check on you and address any questions or concerns that you may have regarding the information given to you following your procedure. If we do not reach you, we will leave a message.  We will attempt to reach you two times.  During this call, we will ask if you have developed any symptoms of COVID 19. If you develop any symptoms (ie: fever, flu-like symptoms, shortness of breath, cough etc.) before then, please call [phone].  If you test positive for Covid 19 in the 2 weeks post procedure, please call and report this information to us.    If any biopsies were taken you will be contacted by phone or by letter within the next 1-3 weeks.  Please call us at [phone] if you have not heard about the biopsies in 3 weeks.    SIGNATURES/CONFIDENTIALITY: You and/or your care partner have signed paperwork which will be entered into your electronic medical record.  These signatures attest to the fact that that the information above on your After Visit Summary has been reviewed and is understood.  Full responsibility of the   confidentiality of this discharge information lies with you and/or your care-partner.  

## 2021-03-13 NOTE — Progress Notes (Signed)
Pt transported to PACU, VSS

## 2021-03-17 ENCOUNTER — Telehealth: Payer: Self-pay

## 2021-03-17 ENCOUNTER — Encounter: Payer: Self-pay | Admitting: Internal Medicine

## 2021-03-17 NOTE — Telephone Encounter (Signed)
  Follow up Call-  Call back number 03/13/2021  Post procedure Call Back phone  # [phone]  Permission to leave phone message Yes  Some recent data might be hidden     Patient questions:  Do you have a fever, pain , or abdominal swelling? No. Pain Score  0 *  Have you tolerated food without any problems? Yes.    Have you been able to return to your normal activities? Yes.    Do you have any questions about your discharge instructions: Diet   No. Medications  No. Follow up visit  No.  Do you have questions or concerns about your Care? No.  Actions: * If pain score is 4 or above: No action needed, pain <4.  Have you developed a fever since your procedure? no  2.   Have you had an respiratory symptoms (SOB or cough) since your procedure? no  3.   Have you tested positive for COVID 19 since your procedure no  4.   Have you had any family members/close contacts diagnosed with the COVID 19 since your procedure?  no   If yes to any of these questions please route to Joylene John, RN and Joella Prince, RN

## 2021-10-05 IMAGING — DX DG CHEST 1V PORT
1 series · 2 of 2 positions shown · non-contrast
Comparison: None.

CLINICAL DATA: Short of breath, E76SD-6Q

EXAM:
PORTABLE CHEST 1 VIEW

[Series 1: chest ap · 0.14mm/px · 2 of 2 slices shown]
[im 1/2]
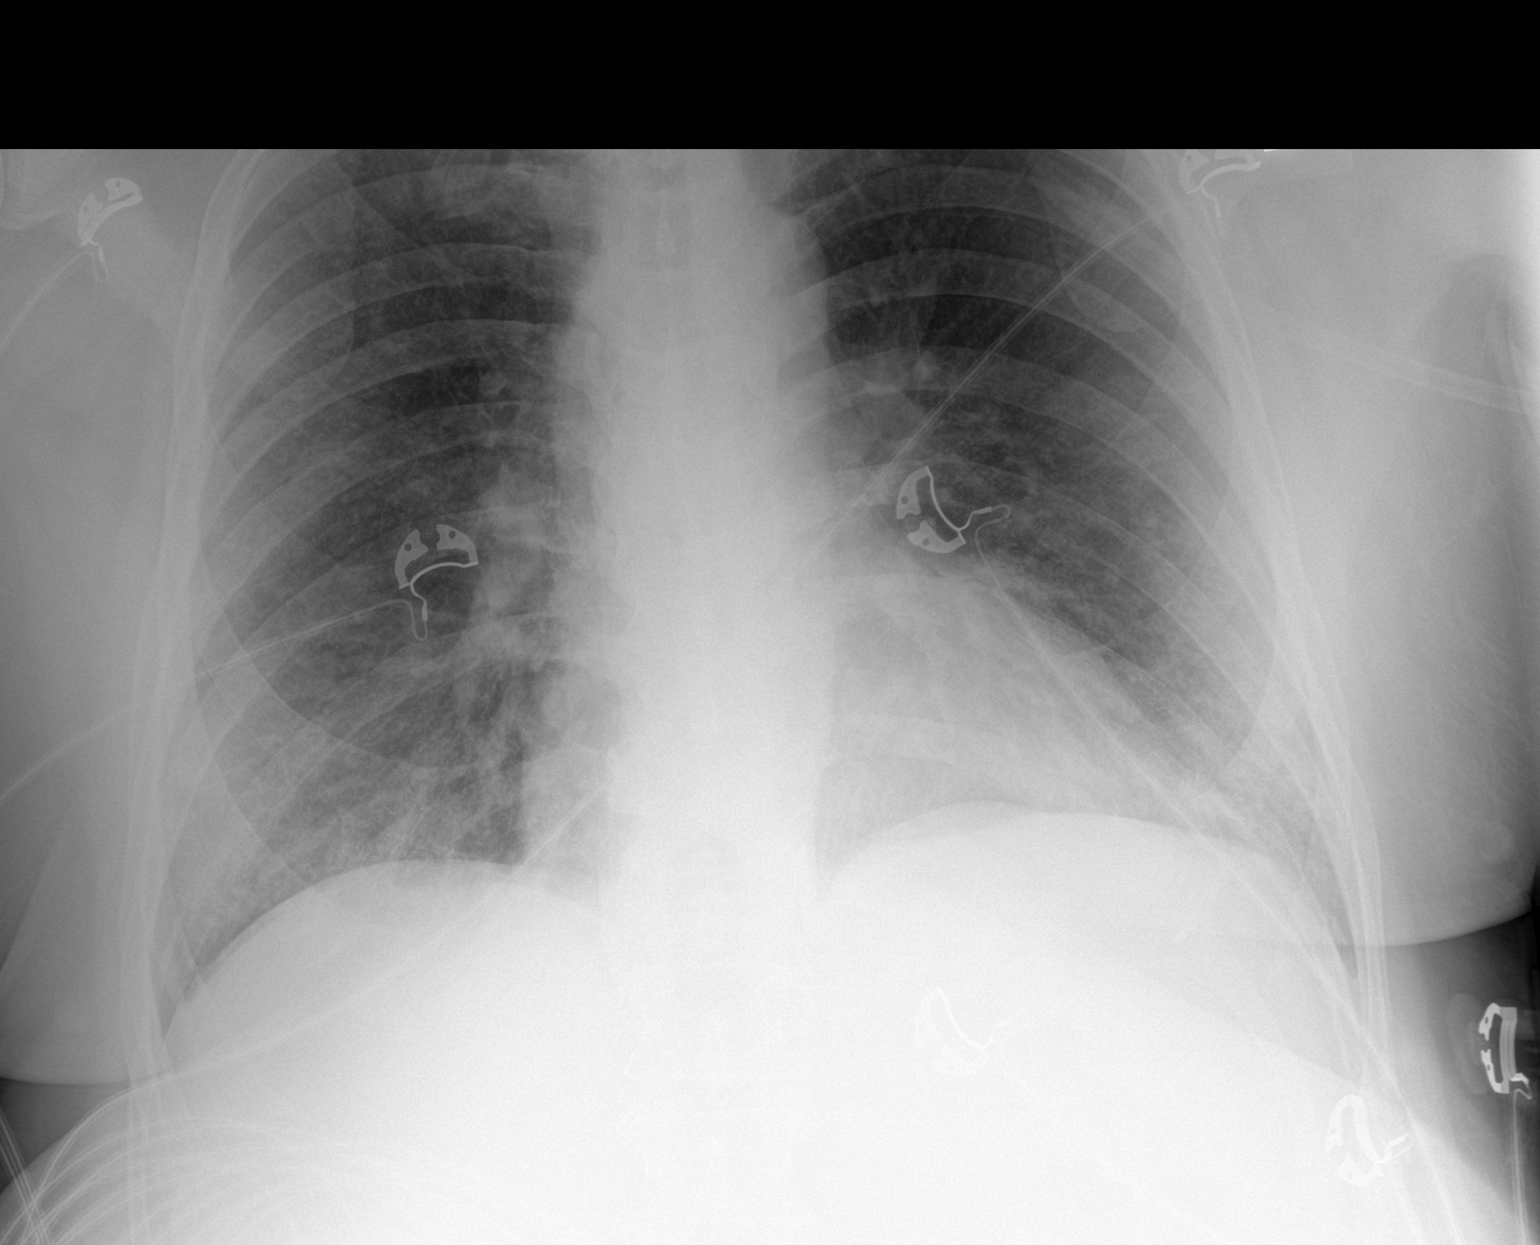
[im 2/2]
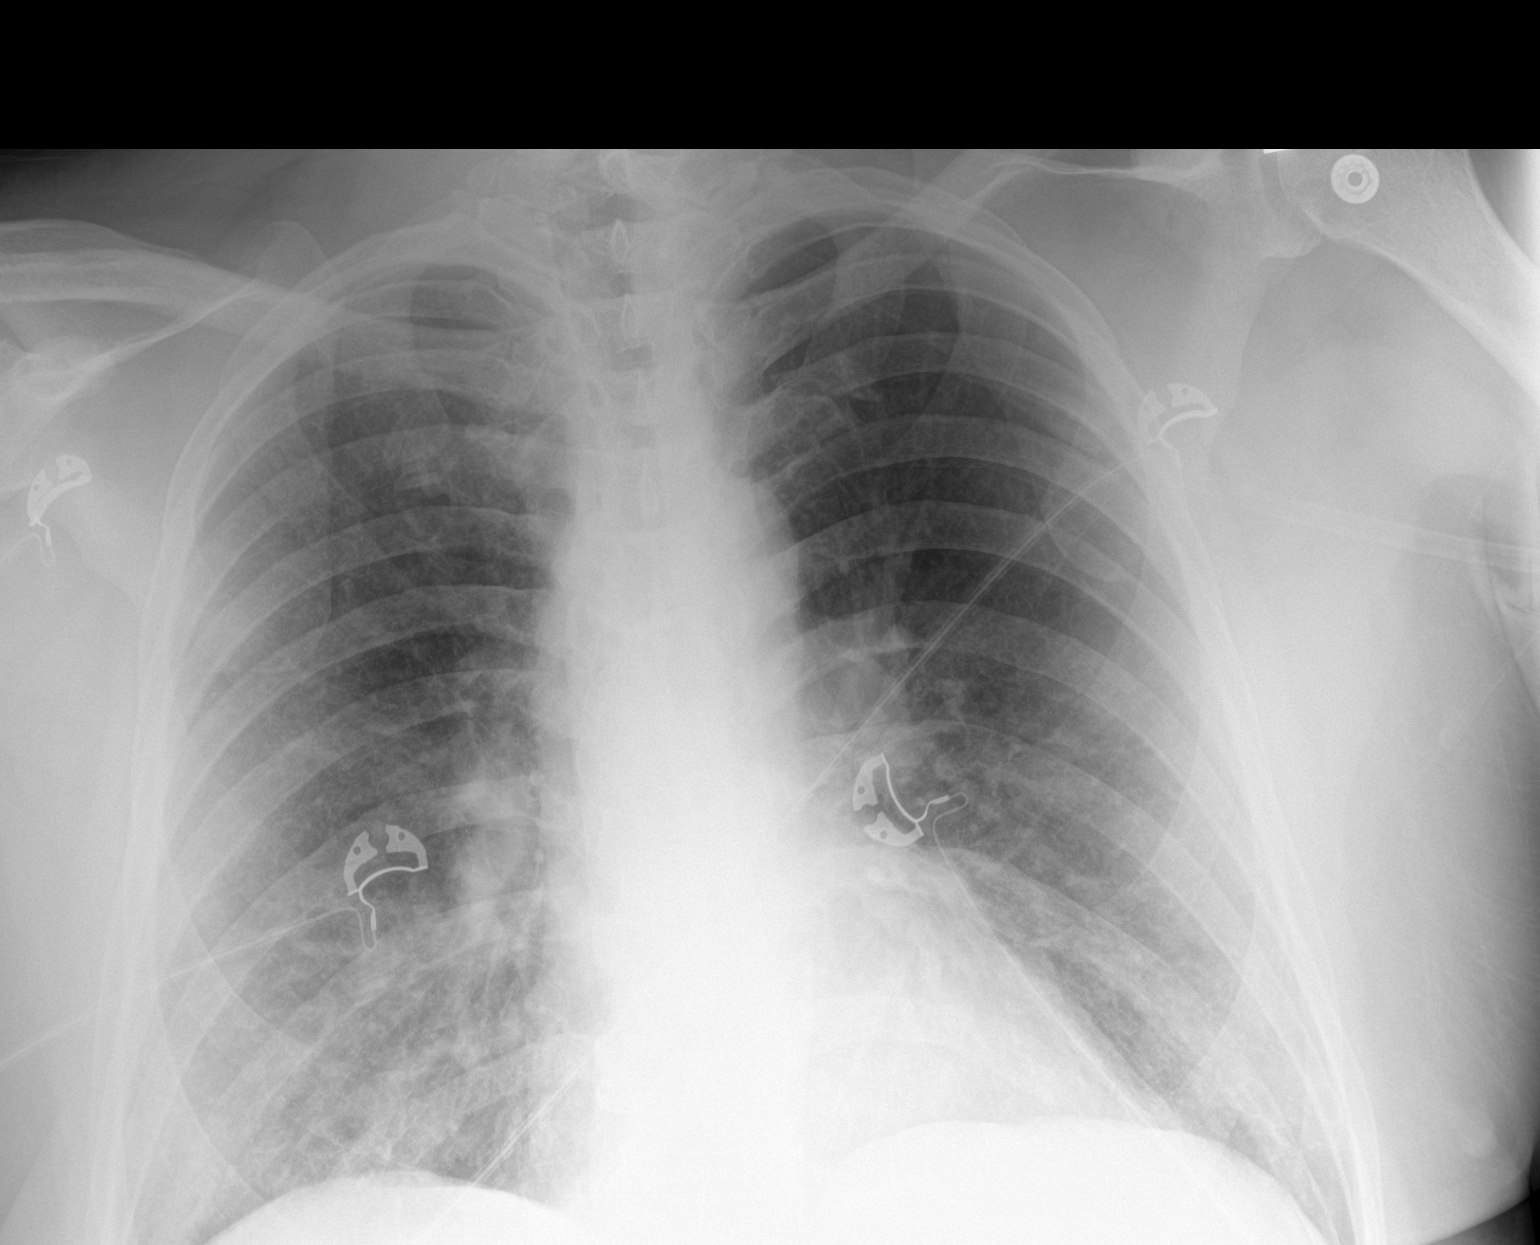

[2 of 2 positions shown; findings below may reference images not displayed]

FINDINGS: 2 frontal views of the chest demonstrate an unremarkable cardiac
silhouette. There is diffuse interstitial prominence, with moderate
multifocal bilateral ground-glass airspace disease greatest at the
lung bases. No effusion or pneumothorax.
IMPRESSION: 1. Moderate bilateral multifocal pneumonia, compatible with
E76SD-6Q.

## 2021-11-16 ENCOUNTER — Ambulatory Visit (INDEPENDENT_AMBULATORY_CARE_PROVIDER_SITE_OTHER): Payer: 59 | Admitting: Nurse Practitioner

## 2021-11-16 ENCOUNTER — Encounter: Payer: Self-pay | Admitting: Nurse Practitioner

## 2021-11-16 VITALS — BP 131/86 | HR 91 | Temp 98.6°F | Ht 67.0 in | Wt 237.0 lb

## 2021-11-16 DIAGNOSIS — H9203 Otalgia, bilateral: Secondary | ICD-10-CM

## 2021-11-16 DIAGNOSIS — H9193 Unspecified hearing loss, bilateral: Secondary | ICD-10-CM | POA: Diagnosis not present

## 2021-11-16 DIAGNOSIS — H6121 Impacted cerumen, right ear: Secondary | ICD-10-CM | POA: Diagnosis not present

## 2021-11-16 MED ORDER — OFLOXACIN 0.3 % OT SOLN
5.0000 [drp] | Freq: Two times a day (BID) | OTIC | 0 refills | Status: DC
Start: 2021-11-16 — End: 2023-01-06

## 2021-11-16 NOTE — Patient Instructions (Signed)
Earache, Adult An earache, or ear pain, can be caused by many things, including: An infection. Ear wax buildup. Ear pressure. Something in the ear that should not be there (foreign body). A sore throat. Tooth problems. Jaw problems. Treatment of the earache will depend on the cause. If the cause is not clear or cannot be determined, you may need to watch your symptoms until your earache goes away or until a cause is found. Follow these instructions at home: Medicines Take or apply over-the-counter and prescription medicines only as told by your health care provider. If you were prescribed an antibiotic medicine, use it as told by your health care provider. Do not stop using the antibiotic even if you start to feel better. Do not put anything in your ear other than medicine that is prescribed by your health care provider. Managing pain If directed, apply heat to the affected area as often as told by your health care provider. Use the heat source that your health care provider recommends, such as a moist heat pack or a heating pad. Place a towel between your skin and the heat source. Leave the heat on for 20-30 minutes. Remove the heat if your skin turns bright red. This is especially important if you are unable to feel pain, heat, or cold. You may have a greater risk of getting burned. If directed, put ice on the affected area as often as told by your health care provider. To do this:     Put ice in a plastic bag. Place a towel between your skin and the bag. Leave the ice on for 20 minutes, 2-3 times a day. General instructions Pay attention to any changes in your symptoms. Try resting in an upright position instead of lying down. This may help to reduce pressure in your ear and relieve pain. Chew gum if it helps to relieve your ear pain. Treat any allergies as told by your health care provider. Drink enough fluid to keep your urine pale yellow. It is up to you to get the results of  any tests that were done. Ask your health care provider, or the department that is doing the tests, when your results will be ready. Keep all follow-up visits as told by your health care provider. This is important. Contact a health care provider if: Your pain does not improve within 2 days. Your earache gets worse. You have new symptoms. You have a fever. Get help right away if you: Have a severe headache. Have a stiff neck. Have trouble swallowing. Have redness or swelling behind your ear. Have fluid or blood coming from your ear. Have hearing loss. Feel dizzy. Summary An earache, or ear pain, can be caused by many things. Treatment of the earache will depend on the cause. Follow recommendations from your health care provider to treat your ear pain. If the cause is not clear or cannot be determined, you may need to watch your symptoms until your earache goes away or until a cause is found. Keep all follow-up visits as told by your health care provider. This is important. This information is not intended to replace advice given to you by your health care provider. Make sure you discuss any questions you have with your health care provider. Document Revised: 11/03/2018 Document Reviewed: 11/04/2018 Elsevier Patient Education  [place] Irrigation Ear irrigation is a procedure to wash dirt and wax out of your ear canal. This procedure is also called lavage. You may need ear irrigation  if you are having trouble hearing because of a buildup of earwax. You may also have ear irrigation as part of the treatment for an ear infection. Getting wax and dirt out of your ear canal can help ear drops work better. Tell a health care provider about: Any allergies you have. All medicines you are taking, including vitamins, herbs, eye drops, creams, and over-the-counter medicines. Any problems you or family members have had with anesthetic medicines. Any blood disorders you have. Any  surgeries you have had. This includes any ear surgeries. Any medical conditions you have. Whether you are pregnant or may be pregnant. What are the risks? Generally, this is a safe procedure. However, problems may occur, including: Infection. Pain. Hearing loss. Fluid and debris being pushed through the eardrum and into the middle ear. This can occur if there are holes in the eardrum. Ear irrigation failing to work. What happens before the procedure? You will talk with your provider about the procedure and plan. You may be given ear drops to put in your ear 15-20 minutes before irrigation. This helps loosen the wax. What happens during the procedure?  A syringe is filled with water or saline solution, which is made of salt and water. The syringe is gently inserted into the ear canal. The fluid is used to flush out wax and other debris. The procedure may vary among health care providers and hospitals. What can I expect after the procedure? After an ear irrigation, follow instructions given to you by your health care provider. Follow these instructions at home: Using ear irrigation kits Ear irrigation kits are available for use at home. Ask your health care provider if this is an option for you. In general, you should: Use a home irrigation kit only as told by your health care provider. Read the package instructions carefully. Follow the directions for using the syringe. Use water that is room temperature. Do not do ear irrigation at home if you: Have diabetes. Diabetes increases the risk of infection. Have a hole or tear in your eardrum. Have tubes in your ears. Have had any ear surgery in the past. Have been told not to irrigate your ears. Cleaning your ears  Clean the outside of your ear with a soft washcloth daily. If told by your health care provider, use a few drops of baby oil, mineral oil, glycerin, hydrogen peroxide, or over-the-counter earwax softening drops. Do not use  cotton swabs to clean your ears. These can push wax down into the ear canal. Do not put anything into your ears to try to remove wax. This includes ear candles. General instructions Take over-the-counter and prescription medicines only as told by your health care provider. If you were prescribed an antibiotic medicine, use it as told by your health care provider. Do not stop using the antibiotic even if your condition improves. Keep the ear clean and dry by following the instructions from your health care provider. Keep all follow-up visits. This is important. Visit your health care provider at least once a year to have your ears and hearing checked. Contact a health care provider if: Your hearing is not improving or is getting worse. You have pain or redness in your ear. You are dizzy. You have ringing in your ears. You have nausea or vomiting. You have fluid, blood, or pus coming out of your ear. Summary Ear irrigation is a procedure to wash dirt and wax out of your ear canal. This procedure is also called lavage. To perform  ear irrigation, ear drops may be put in your ear 15-20 minutes before irrigation. Water or saline solution will be used to flush out earwax and other debris. You may be able to irrigate your ears at home. Ask your health care provider if this is an option for you. Follow your health care provider's instructions. Clean your ears with a soft cloth after irrigation. Do not use cotton swabs to clean your ears. These can push wax down into the ear canal. This information is not intended to replace advice given to you by your health care provider. Make sure you discuss any questions you have with your health care provider. Document Revised: 07/17/2019 Document Reviewed: 07/17/2019 Elsevier Patient Education  [place].

## 2021-11-16 NOTE — Progress Notes (Signed)
Acute Office Visit  Subjective:     Patient ID: Jeffrey Abbott, male    DOB: 1960/10/24, 61 y.o.   MRN: [id number]  Chief Complaint  Patient presents with   Ear Fullness    States that both ears , are full and tender     Ear Fullness  Associated symptoms include ear discharge. Pertinent negatives include no rash.    Review of Systems  Constitutional: Negative.   HENT:  Positive for ear discharge and ear pain. Negative for congestion.   Eyes: Negative.   Respiratory: Negative.    Cardiovascular: Negative.   Skin: Negative.  Negative for itching and rash.  All other systems reviewed and are negative.       Objective:    BP 131/86   Pulse 91   Temp 98.6 F (37 C)   Ht '5\' 7"'$  (1.702 m)   Wt 237 lb (107.5 kg)   SpO2 98%   BMI 37.12 kg/m  BP Readings from Last 3 Encounters:  11/16/21 131/86  03/13/21 138/77  12/16/20 133/78   Wt Readings from Last 3 Encounters:  11/16/21 237 lb (107.5 kg)  03/13/21 233 lb (105.7 kg)  02/20/21 233 lb (105.7 kg)      Physical Exam Vitals and nursing note reviewed.  Constitutional:      Appearance: Normal appearance.  HENT:     Head: Normocephalic.     Right Ear: External ear normal. Decreased hearing noted. Drainage and tenderness present. There is impacted cerumen.     Left Ear: External ear normal. Decreased hearing noted. There is no impacted cerumen.     Nose: Nose normal.     Mouth/Throat:     Mouth: Mucous membranes are moist.  Eyes:     Conjunctiva/sclera: Conjunctivae normal.  Cardiovascular:     Rate and Rhythm: Normal rate and regular rhythm.     Pulses: Normal pulses.     Heart sounds: Normal heart sounds.  Pulmonary:     Effort: Pulmonary effort is normal.     Breath sounds: Normal breath sounds.  Abdominal:     General: Bowel sounds are normal.  Skin:    General: Skin is warm.     Findings: No rash.  Neurological:     Mental Status: He is alert and oriented to person, place, and time.     No  results found for any visits on 11/16/21.      Assessment & Plan:  Earwax irrigated for right ear, patient tolerated well.  Otic solution sent to pharmacy for bilateral ear pain/infection.  Completed a referral to audiology for patient's complaint of bilateral hearing loss.  Patient knows to follow-up with worsening unresolved symptoms.   Problem List Items Addressed This Visit   None Visit Diagnoses     Ear pain, bilateral    -  Primary   Relevant Medications   ofloxacin (FLOXIN OTIC) 0.3 % OTIC solution   Impacted cerumen of right ear       Bilateral hearing loss, unspecified hearing loss type       Relevant Orders   Ambulatory referral to Audiology       Meds ordered this encounter  Medications   ofloxacin (FLOXIN OTIC) 0.3 % OTIC solution    Sig: Place 5 drops into both ears 2 (two) times daily.    Dispense:  5 mL    Refill:  0    Order Specific Question:   Supervising Provider    Answer:  Claretta Fraise [[id number]]    Return if symptoms worsen or fail to improve.  Ivy Lynn, NP

## 2021-11-23 ENCOUNTER — Other Ambulatory Visit: Payer: Self-pay | Admitting: Nurse Practitioner

## 2021-11-23 ENCOUNTER — Telehealth: Payer: Self-pay | Admitting: Nurse Practitioner

## 2021-11-23 MED ORDER — AMOXICILLIN-POT CLAVULANATE 875-125 MG PO TABS: 1.0000 | ORAL_TABLET | Freq: Two times a day (BID) | ORAL | 0 refills | Status: DC

## 2021-11-23 NOTE — Telephone Encounter (Signed)
Patient states that he is still having left ear pressure and having trouble hearing.  States those where the same symptoms that he had before the drops. Denies any fever and states that he has some clear liquid that occasionally comes out of left ear.

## 2021-11-23 NOTE — Telephone Encounter (Signed)
Looks like the visit was 7 days ago, does he want a tele visit to get re assessed? What symptoms is he still having. Any fever, ear discharge??  Or am treating the same ear condition? thanks

## 2021-11-23 NOTE — Telephone Encounter (Signed)
Augmentin sent to pharmacy.

## 2021-11-23 NOTE — Telephone Encounter (Signed)
lmtcb

## 2021-11-23 NOTE — Telephone Encounter (Signed)
Patient seen JE- please advise

## 2021-11-24 MED ORDER — AMOXICILLIN-POT CLAVULANATE 875-125 MG PO TABS: 1.0000 | ORAL_TABLET | Freq: Two times a day (BID) | ORAL | 0 refills | Status: DC

## 2021-11-24 NOTE — Addendum Note (Signed)
Addended by: Brynda Peon F on: 11/24/2021 12:34 PM   Modules accepted: Orders

## 2021-11-24 NOTE — Telephone Encounter (Signed)
Sent to corrected pharmacy

## 2021-11-24 NOTE — Telephone Encounter (Signed)
Send abx to [facility] in [place].

## 2023-01-06 ENCOUNTER — Ambulatory Visit (INDEPENDENT_AMBULATORY_CARE_PROVIDER_SITE_OTHER): Payer: 59 | Admitting: Family Medicine

## 2023-01-06 ENCOUNTER — Encounter: Payer: Self-pay | Admitting: Family Medicine

## 2023-01-06 VITALS — BP 125/83 | HR 82 | Ht 67.0 in | Wt 234.0 lb

## 2023-01-06 DIAGNOSIS — J309 Allergic rhinitis, unspecified: Secondary | ICD-10-CM

## 2023-01-06 MED ORDER — LORATADINE 10 MG PO TABS: 10.0000 mg | ORAL_TABLET | Freq: Every day | ORAL | 11 refills | Status: AC

## 2023-01-06 MED ORDER — FLUTICASONE PROPIONATE 50 MCG/ACT NA SUSP: 1.0000 | Freq: Two times a day (BID) | NASAL | 6 refills | Status: AC | PRN

## 2023-01-06 NOTE — Progress Notes (Signed)
BP 125/83   Pulse 82   Ht 5\' 7"  (1.702 m)   Wt 234 lb (106.1 kg)   SpO2 96%   BMI 36.65 kg/m    Subjective:   Patient ID: Jeffrey Abbott, male    DOB: 1960-09-24, 62 y.o.   MRN: [id number]  HPI: Jeffrey Abbott is a 62 y.o. male presenting on 01/06/2023 for Facial Pain and Tinnitus (bilateral)   HPI Patient is, in today with consistent recurrent congestion and sinus pressure that has been bothering him over the past few years.  He says ever since he had COVID a couple years ago he is never fully recovered from it and he does still get some chest congestion and sometimes feels short of breath from it and then has postnasal drainage and a cough and a postnasal cough that affects him some.  He denies any fevers or chills.  He says this has not gotten any better or worse with some of the treatments that he is got and he has been seen for sinus congestion and sinus infections a couple different times over the past year.  He denies any sick contacts that he knows of.  Relevant past medical, surgical, family and social history reviewed and updated as indicated. Interim medical history since our last visit reviewed. Allergies and medications reviewed and updated.  Review of Systems  Constitutional:  Negative for chills and fever.  HENT:  Positive for congestion, rhinorrhea and sinus pressure. Negative for ear discharge, ear pain, postnasal drip, sneezing, sore throat and voice change.   Eyes:  Negative for pain, discharge, redness and visual disturbance.  Respiratory:  Positive for cough. Negative for wheezing.   Cardiovascular:  Negative for chest pain and leg swelling.  Musculoskeletal:  Negative for gait problem and myalgias.  Skin:  Negative for rash.  All other systems reviewed and are negative.   Per HPI unless specifically indicated above   Allergies as of 01/06/2023       Reactions   Niaspan [niacin Er] Other (See Comments)   Joint pain        Medication List         Accurate as of January 06, 2023  4:23 PM. If you have any questions, ask your nurse or doctor.          STOP taking these medications    ofloxacin 0.3 % OTIC solution Commonly known as: Floxin Otic Stopped by: Elige Radon Loui Massenburg       TAKE these medications    amoxicillin-clavulanate 875-125 MG tablet Commonly known as: AUGMENTIN Take 1 tablet by mouth 2 (two) times daily.   fluticasone 50 MCG/ACT nasal spray Commonly known as: FLONASE Place 1 spray into both nostrils 2 (two) times daily as needed for allergies or rhinitis. Started by: Elige Radon Effie Wahlert   loratadine 10 MG tablet Commonly known as: CLARITIN Take 1 tablet (10 mg total) by mouth daily. Started by: Elige Radon Marlan Steward         Objective:   BP 125/83   Pulse 82   Ht 5\' 7"  (1.702 m)   Wt 234 lb (106.1 kg)   SpO2 96%   BMI 36.65 kg/m   Wt Readings from Last 3 Encounters:  01/06/23 234 lb (106.1 kg)  11/16/21 237 lb (107.5 kg)  03/13/21 233 lb (105.7 kg)    Physical Exam Vitals and nursing note reviewed.  Constitutional:      General: He is not in acute distress.  Appearance: He is well-developed. He is not diaphoretic.  HENT:     Right Ear: Tympanic membrane normal.     Left Ear: Tympanic membrane normal.     Nose: Nose normal. No congestion.     Mouth/Throat:     Mouth: Mucous membranes are moist.     Pharynx: Oropharynx is clear. No oropharyngeal exudate or posterior oropharyngeal erythema.  Eyes:     General: No scleral icterus.    Conjunctiva/sclera: Conjunctivae normal.  Neck:     Thyroid: No thyromegaly.  Cardiovascular:     Rate and Rhythm: Normal rate and regular rhythm.     Heart sounds: Normal heart sounds. No murmur heard. Pulmonary:     Effort: Pulmonary effort is normal. No respiratory distress.     Breath sounds: Normal breath sounds. No wheezing.  Musculoskeletal:        General: No swelling. Normal range of motion.     Cervical back: Neck supple.   Lymphadenopathy:     Cervical: No cervical adenopathy.  Skin:    General: Skin is warm and dry.     Findings: No rash.  Neurological:     Mental Status: He is alert and oriented to person, place, and time.     Coordination: Coordination normal.  Psychiatric:        Behavior: Behavior normal.       Assessment & Plan:   Problem List Items Addressed This Visit   None Visit Diagnoses     Allergic sinusitis    -  Primary   Relevant Medications   fluticasone (FLONASE) 50 MCG/ACT nasal spray   loratadine (CLARITIN) 10 MG tablet       Likely chronic allergic rhinitis, will give Flonase and Claritin follow-up with PCP for physical and if not improved by then he can be further evaluated Follow up plan: Return for Needs chronic follow-up and hip pain check with PCP ASAP.  Counseling provided for all of the vaccine components No orders of the defined types were placed in this encounter.   Arville Care, MD [facility] 01/06/2023, 4:23 PM

## 2023-02-07 ENCOUNTER — Ambulatory Visit: Payer: 59 | Admitting: Nurse Practitioner

## 2023-02-11 ENCOUNTER — Ambulatory Visit (INDEPENDENT_AMBULATORY_CARE_PROVIDER_SITE_OTHER): Payer: 59 | Admitting: Nurse Practitioner

## 2023-02-11 ENCOUNTER — Encounter: Payer: Self-pay | Admitting: Nurse Practitioner

## 2023-02-11 VITALS — BP 135/80 | HR 67 | Temp 98.5°F | Resp 20 | Ht 67.0 in | Wt 241.0 lb

## 2023-02-11 DIAGNOSIS — Z0000 Encounter for general adult medical examination without abnormal findings: Secondary | ICD-10-CM | POA: Diagnosis not present

## 2023-02-11 DIAGNOSIS — Z23 Encounter for immunization: Secondary | ICD-10-CM

## 2023-02-11 NOTE — Progress Notes (Signed)
Subjective:    Patient ID: Jeffrey Abbott, male    DOB: 1960-07-13, 62 y.o.   MRN: [id number]   Chief Complaint: Annual Exam    HPI:  Jeffrey Abbott is a 62 y.o. who identifies as a male who was assigned male at birth.   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 Annual physical exam No current medical issues. No no meds.    New complaints: None today  Allergies  Allergen Reactions   Niaspan [Niacin Er (Antihyperlipidemic)] Other (See Comments)    Joint pain   Outpatient Encounter Medications as of 02/11/2023  Medication Sig   loratadine (CLARITIN) 10 MG tablet Take 1 tablet (10 mg total) by mouth daily.   fluticasone (FLONASE) 50 MCG/ACT nasal spray Place 1 spray into both nostrils 2 (two) times daily as needed for allergies or rhinitis. (Patient not taking: Reported on 02/11/2023)   [DISCONTINUED] amoxicillin-clavulanate (AUGMENTIN) 875-125 MG tablet Take 1 tablet by mouth 2 (two) times daily.   No facility-administered encounter medications on file as of 02/11/2023.    Past Surgical History:  Procedure Laterality Date   COLONOSCOPY  04/27/2016   11 polyps   NASAL SEPTUM SURGERY     POLYPECTOMY     2018 11 polyps-  TA x8  HPP x 3    Family History  Problem Relation Age of Onset   Colon polyps Mother    Asthma Mother    Hypertension Father    Peripheral vascular disease Father 71   Coronary artery disease Father 56   Colon cancer Maternal Grandmother        possibly started in colon, traveled to stomach   Stomach cancer Maternal Grandmother    Colon cancer Maternal Aunt    Liver cancer Maternal Aunt    Esophageal cancer Neg Hx    Rectal cancer Neg Hx       Controlled substance contract: n/a     Review of Systems  Constitutional:  Negative for diaphoresis.  Eyes:  Negative for pain.  Respiratory:  Negative for shortness of breath.    Cardiovascular:  Negative for chest pain, palpitations and leg swelling.  Gastrointestinal:  Negative for abdominal pain.  Endocrine: Negative for polydipsia.  Skin:  Negative for rash.  Neurological:  Negative for dizziness, weakness and headaches.  Hematological:  Does not bruise/bleed easily.  All other systems reviewed and are negative.      Objective:   Physical Exam Constitutional:      Appearance: He is well-developed.  HENT:     Head: Normocephalic.     Right Ear: External ear normal.     Left Ear: External ear normal.     Nose: Nose normal.     Mouth/Throat:     Mouth: Oropharynx is clear and moist.  Eyes:     Extraocular Movements: EOM normal.     Pupils: Pupils are equal, round, and reactive to light.  Neck:     Thyroid: No thyromegaly.     Vascular: No JVD.  Cardiovascular:     Rate and Rhythm: Normal rate and regular rhythm.     Heart sounds: Normal heart sounds. No murmur heard.    No friction rub. No gallop.  Pulmonary:     Effort: Pulmonary effort is normal. No respiratory distress.     Breath sounds: Normal breath sounds. No wheezing or rales.  Chest:  Chest wall: No tenderness.  Abdominal:     General: Bowel sounds are normal.     Palpations: Abdomen is soft. There is no mass.     Tenderness: There is no abdominal tenderness.     Hernia: A hernia (soft umbilical hernia) is present.  Genitourinary:    Penis: Normal.      Prostate: Normal.  Musculoskeletal:        General: Normal range of motion.     Cervical back: Normal range of motion and neck supple.     Right lower leg: Edema (1+) present.     Left lower leg: Edema (1+) present.  Lymphadenopathy:     Cervical: No cervical adenopathy.  Skin:    General: Skin is warm and dry.  Neurological:     Mental Status: He is alert and oriented to person, place, and time.     Cranial Nerves: No cranial nerve deficit.  Psychiatric:        Mood and Affect: Mood and affect normal.        Behavior:  Behavior normal.        Thought Content: Thought content normal.        Judgment: Judgment normal.     BP 135/80   Pulse 67   Temp 98.5 F (36.9 C) (Temporal)   Resp 20   Ht 5\' 7"  (1.702 m)   Wt 241 lb (109.3 kg)   SpO2 96%   BMI 37.75 kg/m        Assessment & Plan:   Jeffrey Abbott comes in today with chief complaint of Annual Exam   Diagnosis and orders addressed:  1. Annual physical exam - CBC with Differential/Platelet - CMP14+EGFR - Lipid panel - Thyroid Panel With TSH - PSA, total and free - VITAMIN D 25 Hydroxy (Vit-D Deficiency, Fractures)   Labs pending Health Maintenance reviewed Diet and exercise encouraged  Follow up plan: 6 months   Jeffrey Daphine Deutscher, FNP

## 2023-02-11 NOTE — Addendum Note (Signed)
Addended by: Bennie Pierini on: 02/11/2023 10:40 AM   Modules accepted: Level of Service

## 2023-02-11 NOTE — Addendum Note (Signed)
Addended by: Cleda Daub on: 02/11/2023 02:38 PM   Modules accepted: Orders

## 2023-02-11 NOTE — Patient Instructions (Signed)
Exercising to Stay Healthy To become healthy and stay healthy, it is recommended that you do moderate-intensity and vigorous-intensity exercise. You can tell that you are exercising at a moderate intensity if your heart starts beating faster and you start breathing faster but can still hold a conversation. You can tell that you are exercising at a vigorous intensity if you are breathing much harder and faster and cannot hold a conversation while exercising. How can exercise benefit me? Exercising regularly is important. It has many health benefits, such as: Improving overall fitness, flexibility, and endurance. Increasing bone density. Helping with weight control. Decreasing body fat. Increasing muscle strength and endurance. Reducing stress and tension, anxiety, depression, or anger. Improving overall health. What guidelines should I follow while exercising? Before you start a new exercise program, talk with your health care provider. Do not exercise so much that you hurt yourself, feel dizzy, or get very short of breath. Wear comfortable clothes and wear shoes with good support. Drink plenty of water while you exercise to prevent dehydration or heat stroke. Work out until your breathing and your heartbeat get faster (moderate intensity). How often should I exercise? Choose an activity that you enjoy, and set realistic goals. Your health care provider can help you make an activity plan that is individually designed and works best for you. Exercise regularly as told by your health care provider. This may include: Doing strength training two times a week, such as: Lifting weights. Using resistance bands. Push-ups. Sit-ups. Yoga. Doing a certain intensity of exercise for a given amount of time. Choose from these options: A total of 150 minutes of moderate-intensity exercise every week. A total of 75 minutes of vigorous-intensity exercise every week. A mix of moderate-intensity and  vigorous-intensity exercise every week. Children, pregnant women, people who have not exercised regularly, people who are overweight, and older adults may need to talk with a health care provider about what activities are safe to perform. If you have a medical condition, be sure to talk with your health care provider before you start a new exercise program. What are some exercise ideas? Moderate-intensity exercise ideas include: Walking 1 mile (1.6 km) in about 15 minutes. Biking. Hiking. Golfing. Dancing. Water aerobics. Vigorous-intensity exercise ideas include: Walking 4.5 miles (7.2 km) or more in about 1 hour. Jogging or running 5 miles (8 km) in about 1 hour. Biking 10 miles (16.1 km) or more in about 1 hour. Lap swimming. Roller-skating or in-line skating. Cross-country skiing. Vigorous competitive sports, such as football, basketball, and soccer. Jumping rope. Aerobic dancing. What are some everyday activities that can help me get exercise? Yard work, such as: Pushing a lawn mower. Raking and bagging leaves. Washing your car. Pushing a stroller. Shoveling snow. Gardening. Washing windows or floors. How can I be more active in my day-to-day activities? Use stairs instead of an elevator. Take a walk during your lunch break. If you drive, park your car farther away from your work or school. If you take public transportation, get off one stop early and walk the rest of the way. Stand up or walk around during all of your indoor phone calls. Get up, stretch, and walk around every 30 minutes throughout the day. Enjoy exercise with a friend. Support to continue exercising will help you keep a regular routine of activity. Where to find more information You can find more information about exercising to stay healthy from: U.S. Department of Health and Human Services: www.hhs.gov Centers for Disease Control and Prevention (  CDC): www.cdc.gov Summary Exercising regularly is  important. It will improve your overall fitness, flexibility, and endurance. Regular exercise will also improve your overall health. It can help you control your weight, reduce stress, and improve your bone density. Do not exercise so much that you hurt yourself, feel dizzy, or get very short of breath. Before you start a new exercise program, talk with your health care provider. This information is not intended to replace advice given to you by your health care provider. Make sure you discuss any questions you have with your health care provider. Document Revised: 07/25/2020 Document Reviewed: 07/25/2020 Elsevier Patient Education  2024 Elsevier Inc.  

## 2023-02-12 LAB — CMP14+EGFR
ALT: 24 [IU]/L (ref 0–44)
AST: 31 IU/L (ref 0–40)
Albumin: 4.2 g/dL (ref 3.9–4.9)
Alkaline Phosphatase: 78 IU/L (ref 44–121)
BUN/Creatinine Ratio: 20 (ref 10–24)
BUN: 22 mg/dL (ref 8–27)
Bilirubin Total: 0.6 mg/dL (ref 0.0–1.2)
CO2: 24 mmol/L (ref 20–29)
Calcium: 9.5 mg/dL (ref 8.6–10.2)
Chloride: 103 mmol/L (ref 96–106)
Creatinine, Ser: 1.09 mg/dL (ref 0.76–1.27)
Globulin, Total: 2.2 g/dL (ref 1.5–4.5)
Glucose: 100 mg/dL — ABNORMAL HIGH (ref 70–99)
Potassium: 4.2 mmol/L (ref 3.5–5.2)
Sodium: 142 mmol/L (ref 134–144)
Total Protein: 6.4 g/dL (ref 6.0–8.5)
eGFR: 76 mL/min/{1.73_m2} (ref >59)

## 2023-02-12 LAB — PSA, TOTAL AND FREE
PSA, Free Pct: 28.8 %
PSA, Free: 0.23 ng/mL
Prostate Specific Ag, Serum: 0.8 ng/mL (ref 0.0–4.0)

## 2023-02-12 LAB — LIPID PANEL
Chol/HDL Ratio: 5.8 ratio — ABNORMAL HIGH (ref 0.0–5.0)
Cholesterol, Total: 156 mg/dL (ref 100–199)
HDL: 27 mg/dL — ABNORMAL LOW (ref >39)
LDL Chol Calc (NIH): 103 mg/dL — ABNORMAL HIGH (ref 0–99)
Triglycerides: 142 mg/dL (ref 0–149)
VLDL Cholesterol Cal: 26 mg/dL (ref 5–40)

## 2023-02-12 LAB — THYROID PANEL WITH TSH
Free Thyroxine Index: 1.3 (ref 1.2–4.9)
T3 Uptake Ratio: 28 % (ref 24–39)
T4, Total: 4.7 ug/dL (ref 4.5–12.0)
TSH: 1.14 u[IU]/mL (ref 0.450–4.500)

## 2023-02-12 LAB — CBC WITH DIFFERENTIAL/PLATELET
Basophils Absolute: 0 10*3/uL (ref 0.0–0.2)
Basos: 0 %
EOS (ABSOLUTE): 0.2 10*3/uL (ref 0.0–0.4)
Eos: 2 %
Hematocrit: 44.5 % (ref 37.5–51.0)
Hemoglobin: 14.9 g/dL (ref 13.0–17.7)
Immature Grans (Abs): 0.1 10*3/uL (ref 0.0–0.1)
Immature Granulocytes: 1 %
Lymphocytes Absolute: 1.5 10*3/uL (ref 0.7–3.1)
Lymphs: 20 %
MCH: 29.8 pg (ref 26.6–33.0)
MCHC: 33.5 g/dL (ref 31.5–35.7)
MCV: 89 fL (ref 79–97)
Monocytes Absolute: 0.6 10*3/uL (ref 0.1–0.9)
Monocytes: 8 %
Neutrophils Absolute: 5 10*3/uL (ref 1.4–7.0)
Neutrophils: 69 %
Platelets: 182 10*3/uL (ref 150–450)
RBC: 5 x10E6/uL (ref 4.14–5.80)
RDW: 13.2 % (ref 11.6–15.4)
WBC: 7.4 10*3/uL (ref 3.4–10.8)

## 2023-02-12 LAB — VITAMIN D 25 HYDROXY (VIT D DEFICIENCY, FRACTURES): Vit D, 25-Hydroxy: 33.2 ng/mL (ref 30.0–100.0)

## 2023-02-14 NOTE — Addendum Note (Signed)
Addended by: Cleda Daub on: 02/14/2023 02:38 PM   Modules accepted: Orders

## 2023-08-15 ENCOUNTER — Ambulatory Visit: Payer: 59 | Admitting: Nurse Practitioner

## 2024-03-16 ENCOUNTER — Ambulatory Visit: Admitting: Nurse Practitioner

## 2024-03-16 ENCOUNTER — Encounter: Payer: Self-pay | Admitting: Nurse Practitioner

## 2024-03-16 ENCOUNTER — Ambulatory Visit

## 2024-03-16 VITALS — BP 128/72 | HR 73 | Temp 97.8°F | Ht 67.0 in | Wt 246.0 lb

## 2024-03-16 DIAGNOSIS — Z23 Encounter for immunization: Secondary | ICD-10-CM

## 2024-03-16 DIAGNOSIS — Z0000 Encounter for general adult medical examination without abnormal findings: Secondary | ICD-10-CM

## 2024-03-16 DIAGNOSIS — Z125 Encounter for screening for malignant neoplasm of prostate: Secondary | ICD-10-CM

## 2024-03-16 LAB — LIPID PANEL

## 2024-03-16 NOTE — Progress Notes (Signed)
 Subjective:    Patient ID: Jeffrey Abbott, male    DOB: 1960-12-22, 63 y.o.   MRN: [id number]   Chief Complaint: Annual Exam    HPI:  Jeffrey Abbott is a 63 y.o. who identifies as a male who was assigned male at birth.   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 Annual physical exam No current medical issues. No no meds.   2. Bmi 38.0-38.9 Weight is up 5 lbs Wt Readings from Last 3 Encounters:  03/16/24 246 lb (111.6 kg)  02/11/23 241 lb (109.3 kg)  01/06/23 234 lb (106.1 kg)   BMI Readings from Last 3 Encounters:  03/16/24 38.53 kg/m  02/11/23 37.75 kg/m  01/06/23 36.65 kg/m      New complaints: None today  Allergies  Allergen Reactions   Niaspan [Niacin Er (Antihyperlipidemic)] Other (See Comments)    Joint pain   Outpatient Encounter Medications as of 03/16/2024  Medication Sig   loratadine  (CLARITIN ) 10 MG tablet Take 1 tablet (10 mg total) by mouth daily.   fluticasone  (FLONASE ) 50 MCG/ACT nasal spray Place 1 spray into both nostrils 2 (two) times daily as needed for allergies or rhinitis. (Patient not taking: Reported on 02/11/2023)   No facility-administered encounter medications on file as of 03/16/2024.    Past Surgical History:  Procedure Laterality Date   COLONOSCOPY  04/27/2016   11 polyps   NASAL SEPTUM SURGERY     POLYPECTOMY     2018 11 polyps-  TA x8  HPP x 3    Family History  Problem Relation Age of Onset   Colon polyps Mother    Asthma Mother    Hypertension Father    Peripheral vascular disease Father 38   Coronary artery disease Father 36   Colon cancer Maternal Grandmother        possibly started in colon, traveled to stomach   Stomach cancer Maternal Grandmother    Colon cancer Maternal Aunt    Liver cancer Maternal Aunt    Esophageal cancer Neg Hx    Rectal cancer Neg Hx       Controlled substance contract:  n/a     Review of Systems  Constitutional:  Negative for diaphoresis.  Eyes:  Negative for pain.  Respiratory:  Negative for shortness of breath.   Cardiovascular:  Negative for chest pain, palpitations and leg swelling.  Gastrointestinal:  Negative for abdominal pain.  Endocrine: Negative for polydipsia.  Skin:  Negative for rash.  Neurological:  Negative for dizziness, weakness and headaches.  Hematological:  Does not bruise/bleed easily.  All other systems reviewed and are negative.      Objective:   Physical Exam Constitutional:      Appearance: He is well-developed.  HENT:     Head: Normocephalic.     Right Ear: External ear normal.     Left Ear: External ear normal.     Nose: Nose normal.  Eyes:     Pupils: Pupils are equal, round, and reactive to light.  Neck:     Thyroid : No thyromegaly.     Vascular: No JVD.  Cardiovascular:     Rate and Rhythm: Normal rate and regular rhythm.     Heart sounds: Normal heart sounds. No murmur heard.    No friction rub. No gallop.  Pulmonary:     Effort: Pulmonary effort is normal. No respiratory  distress.     Breath sounds: Normal breath sounds. No wheezing or rales.  Chest:     Chest wall: No tenderness.  Abdominal:     General: Bowel sounds are normal.     Palpations: Abdomen is soft. There is no mass.     Tenderness: There is no abdominal tenderness.     Hernia: A hernia (soft umbilical hernia) is present.  Genitourinary:    Penis: Normal.      Prostate: Normal.  Musculoskeletal:        General: Normal range of motion.     Cervical back: Normal range of motion and neck supple.     Right lower leg: Edema (1+) present.     Left lower leg: Edema (1+) present.  Lymphadenopathy:     Cervical: No cervical adenopathy.  Skin:    General: Skin is warm and dry.  Neurological:     Mental Status: He is alert and oriented to person, place, and time.     Cranial Nerves: No cranial nerve deficit.  Psychiatric:         Behavior: Behavior normal.        Thought Content: Thought content normal.        Judgment: Judgment normal.     BP 128/72   Pulse 73   Temp 97.8 F (36.6 C) (Temporal)   Ht 5' 7 (1.702 m)   Wt 246 lb (111.6 kg)   SpO2 93%   BMI 38.53 kg/m        Assessment & Plan:  Jeffrey Abbott comes in today with chief complaint of Annual Exam   Diagnosis and orders addressed:  1. Annual physical exam (Primary) Exercise to stay healthy - CBC with Differential/Platelet - Lipid panel - CMP14+EGFR - VITAMIN D  25 Hydroxy (Vit-D Deficiency, Fractures)  2. Prostate cancer screening Labs pending - PSA, total and free   Labs pending Health Maintenance reviewed Diet and exercise encouraged  Follow up plan: 1 year   Mary-Margaret Gladis, FNP

## 2024-03-16 NOTE — Addendum Note (Signed)
 Addended by: GLADIS MUSTARD on: 03/16/2024 09:43 AM   Modules accepted: Level of Service

## 2024-03-16 NOTE — Patient Instructions (Signed)
 Exercising to Stay Healthy To become healthy and stay healthy, it is recommended that you do moderate-intensity and vigorous-intensity exercise. You can tell that you are exercising at a moderate intensity if your heart starts beating faster and you start breathing faster but can still hold a conversation. You can tell that you are exercising at a vigorous intensity if you are breathing much harder and faster and cannot hold a conversation while exercising. How can exercise benefit me? Exercising regularly is important. It has many health benefits, such as: Improving overall fitness, flexibility, and endurance. Increasing bone density. Helping with weight control. Decreasing body fat. Increasing muscle strength and endurance. Reducing stress and tension, anxiety, depression, or anger. Improving overall health. What guidelines should I follow while exercising? Before you start a new exercise program, talk with your health care provider. Do not exercise so much that you hurt yourself, feel dizzy, or get very short of breath. Wear comfortable clothes and wear shoes with good support. Drink plenty of water while you exercise to prevent dehydration or heat stroke. Work out until your breathing and your heartbeat get faster (moderate intensity). How often should I exercise? Choose an activity that you enjoy, and set realistic goals. Your health care provider can help you make an activity plan that is individually designed and works best for you. Exercise regularly as told by your health care provider. This may include: Doing strength training two times a week, such as: Lifting weights. Using resistance bands. Push-ups. Sit-ups. Yoga. Doing a certain intensity of exercise for a given amount of time. Choose from these options: A total of 150 minutes of moderate-intensity exercise every week. A total of 75 minutes of vigorous-intensity exercise every week. A mix of moderate-intensity and  vigorous-intensity exercise every week. Children, pregnant women, people who have not exercised regularly, people who are overweight, and older adults may need to talk with a health care provider about what activities are safe to perform. If you have a medical condition, be sure to talk with your health care provider before you start a new exercise program. What are some exercise ideas? Moderate-intensity exercise ideas include: Walking 1 mile (1.6 km) in about 15 minutes. Biking. Hiking. Golfing. Dancing. Water aerobics. Vigorous-intensity exercise ideas include: Walking 4.5 miles (7.2 km) or more in about 1 hour. Jogging or running 5 miles (8 km) in about 1 hour. Biking 10 miles (16.1 km) or more in about 1 hour. Lap swimming. Roller-skating or in-line skating. Cross-country skiing. Vigorous competitive sports, such as football, basketball, and soccer. Jumping rope. Aerobic dancing. What are some everyday activities that can help me get exercise? Yard work, such as: Child psychotherapist. Raking and bagging leaves. Washing your car. Pushing a stroller. Shoveling snow. Gardening. Washing windows or floors. How can I be more active in my day-to-day activities? Use stairs instead of an elevator. Take a walk during your lunch break. If you drive, park your car farther away from your work or school. If you take public transportation, get off one stop early and walk the rest of the way. Stand up or walk around during all of your indoor phone calls. Get up, stretch, and walk around every 30 minutes throughout the day. Enjoy exercise with a friend. Support to continue exercising will help you keep a regular routine of activity. Where to find more information You can find more information about exercising to stay healthy from: U.S. Department of Health and Human Services: ThisPath.fi Centers for Disease Control and Prevention (  CDC): [employer]Ex[employer].br Summary Exercising regularly is  important. It will improve your overall fitness, flexibility, and endurance. Regular exercise will also improve your overall health. It can help you control your weight, reduce stress, and improve your bone density. Do not exercise so much that you hurt yourself, feel dizzy, or get very short of breath. Before you start a new exercise program, talk with your health care provider. This information is not intended to replace advice given to you by your health care provider. Make sure you discuss any questions you have with your health care provider. Document Revised: 07/25/2020 Document Reviewed: 07/25/2020 Elsevier Patient Education  2024 [employer].

## 2024-03-17 LAB — CBC WITH DIFFERENTIAL/PLATELET
Basophils Absolute: 0 x10E3/uL (ref 0.0–0.2)
Basos: 0 %
EOS (ABSOLUTE): 0.2 x10E3/uL (ref 0.0–0.4)
Eos: 3 %
Hematocrit: 47.7 % (ref 37.5–51.0)
Hemoglobin: 16.1 g/dL (ref 13.0–17.7)
Immature Grans (Abs): 0 x10E3/uL (ref 0.0–0.1)
Immature Granulocytes: 0 %
Lymphocytes Absolute: 1.5 x10E3/uL (ref 0.7–3.1)
Lymphs: 18 %
MCH: 30.1 pg (ref 26.6–33.0)
MCHC: 33.8 g/dL (ref 31.5–35.7)
MCV: 89 fL (ref 79–97)
Monocytes Absolute: 0.7 x10E3/uL (ref 0.1–0.9)
Monocytes: 8 %
Neutrophils Absolute: 6 x10E3/uL (ref 1.4–7.0)
Neutrophils: 71 %
Platelets: 193 x10E3/uL (ref 150–450)
RBC: 5.35 x10E6/uL (ref 4.14–5.80)
RDW: 13.9 % (ref 11.6–15.4)
WBC: 8.5 x10E3/uL (ref 3.4–10.8)

## 2024-03-17 LAB — CMP14+EGFR
ALT: 24 IU/L (ref 0–44)
AST: 19 IU/L (ref 0–40)
Albumin: 4.3 g/dL (ref 3.9–4.9)
Alkaline Phosphatase: 79 IU/L (ref 47–123)
BUN/Creatinine Ratio: 21 (ref 10–24)
BUN: 22 mg/dL (ref 8–27)
Bilirubin Total: 0.4 mg/dL (ref 0.0–1.2)
CO2: 22 mmol/L (ref 20–29)
Calcium: 9.3 mg/dL (ref 8.6–10.2)
Chloride: 104 mmol/L (ref 96–106)
Creatinine, Ser: 1.05 mg/dL (ref 0.76–1.27)
Globulin, Total: 2.1 g/dL (ref 1.5–4.5)
Glucose: 104 mg/dL — AB (ref 70–99)
Potassium: 4.4 mmol/L (ref 3.5–5.2)
Sodium: 142 mmol/L (ref 134–144)
Total Protein: 6.4 g/dL (ref 6.0–8.5)
eGFR: 79 mL/min/1.73 (ref >59)

## 2024-03-17 LAB — LIPID PANEL
Cholesterol, Total: 144 mg/dL (ref 100–199)
HDL: 25 mg/dL — AB (ref >39)
LDL CALC COMMENT:: 5.8 ratio — AB (ref 0.0–5.0)
LDL Chol Calc (NIH): 90 mg/dL (ref 0–99)
Triglycerides: 164 mg/dL — AB (ref 0–149)
VLDL Cholesterol Cal: 29 mg/dL (ref 5–40)

## 2024-03-17 LAB — PSA, TOTAL AND FREE
PSA, Free Pct: 30 %
PSA, Free: 0.21 ng/mL
Prostate Specific Ag, Serum: 0.7 ng/mL (ref 0.0–4.0)

## 2024-03-17 LAB — VITAMIN D 25 HYDROXY (VIT D DEFICIENCY, FRACTURES): Vit D, 25-Hydroxy: 27.1 ng/mL — AB (ref 30.0–100.0)

## 2024-03-19 ENCOUNTER — Ambulatory Visit: Payer: Self-pay | Admitting: Nurse Practitioner

## 2024-06-21 ENCOUNTER — Encounter: Admitting: Nurse Practitioner

## 2025-03-18 ENCOUNTER — Encounter: Admitting: Nurse Practitioner
